# Patient Record
Sex: Female | Born: 1990 | Race: Black or African American | Hispanic: No | Marital: Single | Smoking: Never smoker
Health system: Southern US, Community
[De-identification: ages and names within clinical notes are randomized; demographics above are authoritative.]

---
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
## 2010-10-05 ENCOUNTER — Inpatient Hospital Stay (INDEPENDENT_AMBULATORY_CARE_PROVIDER_SITE_OTHER)
Admission: RE | Admit: 2010-10-05 | Discharge: 2010-10-05 | Disposition: A | Discharge status: Home / Self Care | Payer: 59 | Source: Ambulatory Visit | Attending: Family Medicine | Admitting: Family Medicine

## 2010-10-05 DIAGNOSIS — J069 Acute upper respiratory infection, unspecified: Secondary | ICD-10-CM

## 2010-12-01 ENCOUNTER — Emergency Department (HOSPITAL_COMMUNITY)
Admission: EM | Admit: 2010-12-01 | Discharge: 2010-12-02 | Disposition: A | Discharge status: Home / Self Care | Payer: No Typology Code available for payment source | Attending: Emergency Medicine | Admitting: Emergency Medicine

## 2010-12-01 ENCOUNTER — Emergency Department (HOSPITAL_COMMUNITY): Payer: No Typology Code available for payment source

## 2010-12-01 DIAGNOSIS — Y929 Unspecified place or not applicable: Secondary | ICD-10-CM | POA: Insufficient documentation

## 2010-12-01 DIAGNOSIS — S8010XA Contusion of unspecified lower leg, initial encounter: Secondary | ICD-10-CM | POA: Insufficient documentation

## 2010-12-01 DIAGNOSIS — M79609 Pain in unspecified limb: Secondary | ICD-10-CM | POA: Insufficient documentation
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
## 2014-06-11 ENCOUNTER — Emergency Department (HOSPITAL_COMMUNITY): Admission: EM | Admit: 2014-06-11 | Discharge: 2014-06-11 | Discharge status: Absent without leave | Payer: Self-pay

## 2014-06-11 ENCOUNTER — Emergency Department (HOSPITAL_COMMUNITY)
Admission: EM | Admit: 2014-06-11 | Discharge: 2014-06-11 | Disposition: A | Discharge status: Home / Self Care | Payer: 59 | Attending: Emergency Medicine | Admitting: Emergency Medicine

## 2014-06-11 ENCOUNTER — Encounter (HOSPITAL_COMMUNITY): Payer: Self-pay | Admitting: Emergency Medicine

## 2014-06-11 DIAGNOSIS — F430 Acute stress reaction: Secondary | ICD-10-CM | POA: Diagnosis not present

## 2014-06-11 DIAGNOSIS — Z3202 Encounter for pregnancy test, result negative: Secondary | ICD-10-CM | POA: Insufficient documentation

## 2014-06-11 DIAGNOSIS — R630 Anorexia: Secondary | ICD-10-CM | POA: Insufficient documentation

## 2014-06-11 DIAGNOSIS — F329 Major depressive disorder, single episode, unspecified: Secondary | ICD-10-CM | POA: Insufficient documentation

## 2014-06-11 DIAGNOSIS — F32A Depression, unspecified: Secondary | ICD-10-CM

## 2014-06-11 LAB — ETHANOL

## 2014-06-11 LAB — COMPREHENSIVE METABOLIC PANEL
ALBUMIN: 3.9 g/dL (ref 3.5–5.2)
ALT: 9 U/L (ref 0–35)
AST: 15 U/L (ref 0–37)
Alkaline Phosphatase: 58 U/L (ref 39–117)
Anion gap: 13 (ref 5–15)
BUN: 7 mg/dL (ref 6–23)
CALCIUM: 9.8 mg/dL (ref 8.4–10.5)
CO2: 25 mEq/L (ref 19–32)
CREATININE: 0.86 mg/dL (ref 0.50–1.10)
Chloride: 101 mEq/L (ref 96–112)
GFR calc Af Amer: >90 mL/min (ref >90)
Glucose, Bld: 96 mg/dL (ref 70–99)
Potassium: 3.7 mEq/L (ref 3.7–5.3)
Sodium: 139 mEq/L (ref 137–147)
TOTAL PROTEIN: 7.8 g/dL (ref 6.0–8.3)
Total Bilirubin: 0.2 mg/dL — ABNORMAL LOW (ref 0.3–1.2)

## 2014-06-11 LAB — SALICYLATE LEVEL

## 2014-06-11 LAB — CBC
HCT: 42.9 % (ref 36.0–46.0)
Hemoglobin: 14.7 g/dL (ref 12.0–15.0)
MCH: 30.6 pg (ref 26.0–34.0)
MCHC: 34.3 g/dL (ref 30.0–36.0)
MCV: 89.4 fL (ref 78.0–100.0)
PLATELETS: 220 10*3/uL (ref 150–400)
RBC: 4.8 MIL/uL (ref 3.87–5.11)
RDW: 12.1 % (ref 11.5–15.5)
WBC: 5.5 10*3/uL (ref 4.0–10.5)

## 2014-06-11 LAB — RAPID URINE DRUG SCREEN, HOSP PERFORMED
Amphetamines: NOT DETECTED
BENZODIAZEPINES: NOT DETECTED
Barbiturates: NOT DETECTED
Cocaine: NOT DETECTED
OPIATES: NOT DETECTED
Tetrahydrocannabinol: NOT DETECTED

## 2014-06-11 LAB — POC URINE PREG, ED: PREG TEST UR: NEGATIVE

## 2014-06-11 LAB — ACETAMINOPHEN LEVEL: Acetaminophen (Tylenol), Serum: <15 ug/mL (ref 10–30)

## 2014-06-11 NOTE — ED Notes (Signed)
Patient states she is coming for help with depression. Patient states she would like to be placed on an antidepressant. Patient reports a history of suicidal thoughts, but denies them at this time. Patient has an appointment scheduled in November for an initial appointment with a psychiatrist. Patient accompanied by her mother who reports a long family history of depression. Patient states she currently feels depressed over school, states she is behind and is starting to preform poorly. 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

## 2014-06-11 NOTE — Discharge Instructions (Signed)
Depression °Depression refers to feeling sad, low, down in the dumps, blue, gloomy, or empty. In general, there are two kinds of depression: °1. Normal sadness or normal grief. This kind of depression is one that we all feel from time to time after upsetting life experiences, such as the loss of a job or the ending of a relationship. This kind of depression is considered normal, is short lived, and resolves within a few days to 2 weeks. Depression experienced after the loss of a loved one (bereavement) often lasts longer than 2 weeks but normally gets better with time. °2. Clinical depression. This kind of depression lasts longer than normal sadness or normal grief or interferes with your ability to function at home, at work, and in school. It also interferes with your personal relationships. It affects almost every aspect of your life. Clinical depression is an illness. °Symptoms of depression can also be caused by conditions other than those mentioned above, such as: °· Physical illness. Some physical illnesses, including underactive thyroid gland (hypothyroidism), severe anemia, specific types of cancer, diabetes, uncontrolled seizures, heart and lung problems, strokes, and chronic pain are commonly associated with symptoms of depression. °· Side effects of some prescription medicine. In some people, certain types of medicine can cause symptoms of depression. °· Substance abuse. Abuse of alcohol and illicit drugs can cause symptoms of depression. °SYMPTOMS °Symptoms of normal sadness and normal grief include the following: °· Feeling sad or crying for short periods of time. °· Not caring about anything (apathy). °· Difficulty sleeping or sleeping too much. °· No longer able to enjoy the things you used to enjoy. °· Desire to be by oneself all the time (social isolation). °· Lack of energy or motivation. °· Difficulty concentrating or remembering. °· Change in appetite or weight. °· Restlessness or  agitation. °Symptoms of clinical depression include the same symptoms of normal sadness or normal grief and also the following symptoms: °· Feeling sad or crying all the time. °· Feelings of guilt or worthlessness. °· Feelings of hopelessness or helplessness. °· Thoughts of suicide or the desire to harm yourself (suicidal ideation). °· Loss of touch with reality (psychotic symptoms). Seeing or hearing things that are not real (hallucinations) or having false beliefs about your life or the people around you (delusions and paranoia). °DIAGNOSIS  °The diagnosis of clinical depression is usually based on how bad the symptoms are and how long they have lasted. Your health care provider will also ask you questions about your medical history and substance use to find out if physical illness, use of prescription medicine, or substance abuse is causing your depression. Your health care provider may also order blood tests. °TREATMENT  °Often, normal sadness and normal grief do not require treatment. However, sometimes antidepressant medicine is given for bereavement to ease the depressive symptoms until they resolve. °The treatment for clinical depression depends on how bad the symptoms are but often includes antidepressant medicine, counseling with a mental health professional, or both. Your health care provider will help to determine what treatment is best for you. °Depression caused by physical illness usually goes away with appropriate medical treatment of the illness. If prescription medicine is causing depression, talk with your health care provider about stopping the medicine, decreasing the dose, or changing to another medicine. °Depression caused by the abuse of alcohol or illicit drugs goes away when you stop using these substances. Some adults need professional help in order to stop drinking or using drugs. °SEEK IMMEDIATE MEDICAL   CARE IF: °· You have thoughts about hurting yourself or others. °· You lose touch  with reality (have psychotic symptoms). °· You are taking medicine for depression and have a serious side effect. °FOR MORE INFORMATION °· National Alliance on Mental Illness: www.nami.org  °· National Institute of Mental Health: www.nimh.nih.gov  °Document Released: 07/28/2000 Document Revised: 12/15/2013 Document Reviewed: 10/30/2011 °ExitCare® Patient Information ©2015 [employer]. This information is not intended to replace advice given to you by your health care provider. Make sure you discuss any questions you have with your health care provider. ° °Suicidal Feelings, How to Help Yourself °Everyone feels sad or unhappy at times, but depressing thoughts and feelings of hopelessness can lead to thoughts of suicide. It can seem as if life is too tough to handle. If you feel as though you have reached the point where suicide is the only answer, it is time to let someone know immediately.  °HOW TO COPE AND PREVENT SUICIDE °· Let family, friends, teachers, or counselors know. Get help. Try not to isolate yourself from those who care about you. Even though you may not feel sociable, talk with someone every day. It is best if it is face-to-face. Remember, they will want to help you. °· Eat a regularly spaced and well-balanced diet. °· Get plenty of rest. °· Avoid alcohol and drugs because they will only make you feel worse and may also lower your inhibitions. Remove them from the home. If you are thinking of taking an overdose of your prescribed medicines, give your medicines to someone who can give them to you one day at a time. If you are on antidepressants, let your caregiver know of your feelings so he or she can provide a safer medicine, if that is a concern. °· Remove weapons or poisons from your home. °· Try to stick to routines. Follow a schedule and remind yourself that you have to keep that schedule every day. °· Set some realistic goals and achieve them. Make a list and cross things off as you go.  Accomplishments give a sense of worth. Wait until you are feeling better before doing things you find difficult or unpleasant to do. °· If you are able, try to start exercising. Even half-hour periods of exercise each day will make you feel better. Getting out in the sun or into nature helps you recover from depression faster. If you have a favorite place to walk, take advantage of that. °· Increase safe activities that have always given you pleasure. This may include playing your favorite music, reading a good book, painting a picture, or playing your favorite instrument. Do whatever takes your mind off your depression. °· Keep your living space well-lighted. °GET HELP °Contact a suicide hotline, crisis center, or local suicide prevention center for help right away. Local centers may include a hospital, clinic, community service organization, social service provider, or health department. °· Call your local emergency services (911 in the [place]). °· Call a suicide hotline: °¨ 1-800-273-TALK (1-800-273-8255) in the [place]. °¨ 1-800-SUICIDE (1-800-784-2433) in the [place]. °¨ 1-888-628-9454 in the [place] for Spanish-speaking counselors. °¨ 1-800-799-4TTY (1-800-799-4889) in the [place] for TTY users. °· Visit the following websites for information and help: °¨ National Suicide Prevention Lifeline: www.suicidepreventionlifeline.org °¨ Hopeline: www.hopeline.com °¨ American Foundation for Suicide Prevention: www.afsp.org °· For lesbian, gay, bisexual, transgender, or questioning youth, contact The Trevor Project: °¨ 1-866-4-U-TREVOR (1-866-488-7386) in the [place]. °¨ www.thetrevorproject.org °· In [place], treatment resources are listed in each   [place] with listings available under USAA for [employer] or similar titles. Another source for Crisis Centres by [place] is located at  http://www.suicideprevention.ca/in-crisis-now/find-a-crisis-centre-now/crisis-centres Document Released: 02/04/2003 Document Revised: 10/23/2011 Document Reviewed: 11/25/2013 [facility] Patient Information 2015 [place], [place]. This information is not intended to replace advice given to you by your health care provider. Make sure you discuss any questions you have with your health care provider.   Emergency Department Resource Guide 1) Find a Doctor and Pay Out of Pocket Although you won't have to find out who is covered by your insurance plan, it is a good idea to ask around and get recommendations. You will then need to call the office and see if the doctor you have chosen will accept you as a new patient and what types of options they offer for patients who are self-pay. Some doctors offer discounts or will set up payment plans for their patients who do not have insurance, but you will need to ask so you aren't surprised when you get to your appointment.  2) Contact Your Local Health Department Not all health departments have doctors that can see patients for sick visits, but many do, so it is worth a call to see if yours does. If you don't know where your local health department is, you can check in your phone book. The CDC also has a tool to help you locate your state's health department, and many state websites also have listings of all of their local health departments.  3) Find a [facility] [place] [facility] If your illness is not likely to be very severe or complicated, you may want to try a walk in clinic. These are popping up all over the country in pharmacies, drugstores, and shopping centers. They're usually staffed by nurse practitioners or physician assistants that have been trained to treat common illnesses and complaints. They're usually fairly quick and inexpensive. However, if you have serious medical issues or chronic medical problems, these are probably not your best option.  No Primary Care  Doctor: - Call Health Connect at  [phone] - they can help you locate a primary care doctor that  accepts your insurance, provides certain services, etc. - Physician Referral Service- [phone]  Chronic Pain Problems: Organization         Address  Phone   Notes  [facility]  [phone] Patients need to be referred by their primary care doctor.   Medication Assistance: Organization         Address  Phone   Notes  [facility] Medication [facility] [address] [place], [place] [address] [phone] --Must be a resident of [facility] -- Must have NO insurance coverage whatsoever (no Medicaid/ Medicare, etc.) -- The pt. MUST have a primary care doctor that directs their care regularly and follows them in the community   MedAssist  [phone]   [employer]  [phone]    Agencies that provide inexpensive medical care: Organization         Address  Phone   Notes  [place]  [phone]   [facility]    [phone]   [facility] [place], [place] [address] [phone]   [place] [phone]   Planned Parenthood    [phone]   [facility]    [phone]   [facility]  [address], [place] Phone:  [phone], Fax:  [phone] Hours of Operation:  9 am - 6 pm, M-F.  Also accepts Medicaid/Medicare and self-pay.  [facility] for [place] [address], [address], [place] Phone: [phone], Fax: [phone]. Hours of Operation:  8:30 am - 5:30 pm, M-F.  Also accepts Medicaid and self-pay.  [facility] [place] [address], [place] Phone: [phone]   [place], [place], [place] [phone], Ext. [phone] Mondays & Thursdays: 7-9 AM.  First 15 patients are seen on a first  come, first serve basis.    [place] Providers:  Organization         Address  Phone   Notes  [facility] [address], [place] [phone] Also accepts self-pay patients.  [facility] [address], [place]  [phone]   [place], [address], [place] [phone]   [facility] [address], [place] [phone]   Lucianne Lei [address], [place]   [phone] Only accepts [place] Access [place] patients after they have their name applied to their card.   Self-Pay (no insurance) in [facility]:  Organization         Address  Phone   Notes  Sickle Cell Patients, [facility] [place] [phone]   [facility] [place] [phone]   [facility] [place]  [place], [place],  [phone]   [facility]/Dr. Osei-Bonsu  [address] [place] or [address], [place] [phone] Phone number for both [place] and [place] locations is the same.  Urgent Medical and [facility] [address], [place] [phone]   [facility] [address] [place] or [address] [phone]   [facility] [address] [place] [phone], phone; [phone], fax Sees patients 1st and 3rd Saturday of every month.  Must not qualify for public or private insurance (i.e. Medicaid, Medicare, [place] Health Choice, Veterans' Benefits)  Household income should be no more than 200% of the poverty level The clinic cannot treat you if you are pregnant or think you are pregnant  Sexually transmitted diseases are not treated at the clinic.    Dental Care: Organization          Address  Phone  Notes  [facility] Department of [facility] [place] [phone] Accepts children up to age 5 who are enrolled in [place] or [place]; pregnant women with a Medicaid card; and children who have applied for Medicaid or [place] Health Choice, but were declined, whose parents can pay a reduced fee at time of service.  [facility] Department of [facility]  [address], [place] [phone] Accepts children up to age 14 who are enrolled in [place] or [place]; pregnant women with a Medicaid card; and children who have applied for Medicaid or  Health Choice, but were declined, whose parents can pay a reduced fee at time of service.  [facility] Adult Dental Access PROGRAM  [place], [place] [phone]  Patients are seen by appointment only. Walk-ins are not accepted. [place] will see patients 62 years of age and older. Monday - Tuesday (8am-5pm) Most Wednesdays (8:30-5pm) $30 per visit, cash only  [facility] Adult Dental Access PROGRAM  [address], [facility] [phone] Patients are seen by appointment only. Walk-ins are not accepted. [place] will see patients 76 years of age and older. One Wednesday Evening (Monthly: Volunteer Based).  $30 per visit, cash only  [place]  [phone] for adults; Children under age 48, call Graduate Pediatric Dentistry at [phone]. Children aged 49-14, please call [phone] to request a pediatric application.  Dental services are provided in all areas of dental care including fillings, crowns and bridges, complete and partial dentures, implants, gum treatment, root canals, and extractions. Preventive care is also provided. Treatment is provided to both adults and children. Patients are selected via a lottery and there is often a waiting list.   [facility] [address] [place]  [phone] [email or web address]   [facility] [address] [place], [place] [phone], Ext. [phone] Second and Fourth Thursday of each month, opens at 6:30 AM; Clinic ends at 9 AM.  Patients are seen on a first-come first-served basis, and a limited number are seen during each clinic.   [facility]  [address] Hillard Danker [place], [place] [phone]   Eligibility Requirements You must have lived in [place], [place], or [place] counties for at least the last three months.   You cannot be eligible for state or federal sponsored [employer], including [employer], [place], or [employer].   You generally cannot be eligible for healthcare insurance through your employer.    How to apply: Eligibility screenings are held every Tuesday and Wednesday afternoon from 1:00 pm until 4:00 pm. You do not need an appointment for the interview!  [facility] [address] [place], [place]   [place]  [place] Department  [place]  [phone]    Behavioral Health Resources in the Community: Intensive Outpatient Programs Organization         Address  Phone  Notes  [place]. [address] [place], [place] [phone]   [facility] [place] [facility] Outpatient [address], [place], [place]   ADS: Alcohol & Drug Svcs [address] [place], [place]   [place] [address],  [place], [place] or [phone]   Substance Abuse Resources Organization         Address  Phone  Notes  Alcohol and Drug Services  [phone]   [place]  [phone]   [facility] [place]   Chinita Pester  [phone]   Residential & Outpatient Substance Abuse Program  [phone]   Psychological  Services Organization         Address  Phone  Notes  [facility] [place]  [place]  [phone]   [place] [address], [place] or [phone]    Mobile Crisis Teams Organization         Address  Phone  Notes  [facility], Mobile Crisis Care Unit  [phone]   [facility] Psychotherapeutic [facility]  [address] [place], [place]   [facility] [address], [place]  [place] [phone]    Self-Help/Support Groups Organization         Address  Phone             Notes  Mental Health Assoc. of [place] - variety of support groups  [place] Call for more information  Narcotics Anonymous (NA), [facility] [address], [employer] [place]  2 meetings at this location   Special educational needs teacher         Address  Phone  Notes  [facility] Residential Treatment [place],    [place]  [phone]   [facility]  [address] [place] [address], [place], [place]   [place], [place] [phone] Admissions: 8am-3pm M-F  Incentives Substance [place] [address],    [place], [place] [phone]   [facility] [address] [place], [place], [place]   [facility] [address]  [place], [place]   [facility] - Intensive Outpatient [address], [place], [place]   [facility] [place]  [place], [place] [phone] or [phone]   [facility] Services (RT[facility]) [address], [place], [place] Accepts Medicaid  [facility] [place] [address]  [place] [phone] Substance Abuse/Addiction Treatment   [facility] Organization         Address  Phone  Notes  [facility]  [phone]   Domenic Schwab, PhD [address] [place], [place]   [phone] or [phone]   Luis Lopez [place], [place] [phone]   [facility] [address], [place], [place] [phone] Insurance/Medicaid/sponsorship through [facility] and Families [address], [address]                                    [place], [place] [phone] [place] [address], [place] [phone]    Dr. Adele Schilder  [phone]   [facility] Dept. 1) [address], [place] 2) [place] 3)  [place] [address], [place] [phone]  [phone]   [place] [phone] or [phone] (After Hours)

## 2014-06-11 NOTE — ED Provider Notes (Signed)
CSN: [id number]     Arrival date & time 06/11/14  1937 History   First MD Initiated Contact with Patient 06/11/14 2027     Chief Complaint  Patient presents with  . Depression     (Consider location/radiation/quality/duration/timing/severity/associated sxs/prior Treatment) Patient is a 23 y.o. female presenting with mental health disorder.  Mental Health Problem Presenting symptoms: depression   Degree of incapacity (severity):  Severe Onset quality:  Gradual Duration: 1 year. Timing:  Constant Progression:  Worsening Chronicity:  Chronic Context: stressful life event (School stress)   Relieved by:  Nothing Associated symptoms: anhedonia, appetite change, feelings of worthlessness and trouble in school   Associated symptoms: no headaches   Risk factors: no hx of suicide attempts     History reviewed. No pertinent past medical history. History reviewed. No pertinent past surgical history. No family history on file. History  Substance Use Topics  . Smoking status: Never Smoker   . Smokeless tobacco: Not on file  . Alcohol Use: Yes     Comment: socially   OB History   Grav Para Term Preterm Abortions TAB SAB Ect Mult Living                 Review of Systems  Constitutional: Positive for appetite change.  Neurological: Negative for headaches.  All other systems reviewed and are negative.     Allergies  Sulfa antibiotics  Home Medications   Prior to Admission medications   Not on File   BP 119/72  Pulse 62  Temp(Src) 98.1 F (36.7 C)  Resp 18  Ht 5\' 5"  (1.651 m)  Wt 160 lb (72.576 kg)  BMI 26.63 kg/m2  SpO2 100% Physical Exam  Nursing note and vitals reviewed. Constitutional: She is oriented to person, place, and time. She appears well-developed and well-nourished. No distress.  HENT:  Head: Normocephalic and atraumatic.  Eyes: Conjunctivae are normal. No scleral icterus.  Neck: Neck supple.  Cardiovascular: Normal rate and intact distal pulses.    Pulmonary/Chest: Effort normal. No stridor. No respiratory distress.  Abdominal: Normal appearance. She exhibits no distension.  Neurological: She is alert and oriented to person, place, and time.  Skin: Skin is warm and dry. No rash noted.  Psychiatric: Her behavior is normal. She exhibits a depressed mood. She expresses no homicidal and no suicidal ideation. She expresses no suicidal plans and no homicidal plans.    ED Course  Procedures (including critical care time) Labs Review Labs Reviewed  COMPREHENSIVE METABOLIC PANEL - Abnormal; Notable for the following:    Total Bilirubin 0.2 (*)    All other components within normal limits  SALICYLATE LEVEL - Abnormal; Notable for the following:    Salicylate Lvl <2.0 (*)    All other components within normal limits  ACETAMINOPHEN LEVEL  CBC  ETHANOL  URINE RAPID DRUG SCREEN (HOSP PERFORMED)  POC URINE PREG, ED    Imaging Review No results found.   EKG Interpretation None      MDM   Final diagnoses:  Depression    Physical female who presents with chief complaint of depression. This been going on for about a year. She states that she has occasionally had thoughts of suicide over the past year, but none currently. She reports having adequate support network. She does not have access to any weapons. Her mother is with her providing support. She appears appropriate for outpatient psychiatric treatment. She was advised that if her suicidal ideations return, she needs to seek immediate medical  care. Patient agrees to this plan. Given outpatient resources.    Warnell Foresterrey Merisa Julio, MD 06/11/14 2249

## 2014-06-11 NOTE — ED Notes (Signed)
Pt left before she could be given discharge paperwork.

## 2021-05-29 ENCOUNTER — Other Ambulatory Visit: Payer: Self-pay

## 2021-05-29 ENCOUNTER — Ambulatory Visit (HOSPITAL_COMMUNITY)
Admission: EM | Admit: 2021-05-29 | Discharge: 2021-05-29 | Disposition: A | Discharge status: Home / Self Care | Payer: No Payment, Other | Attending: Family | Admitting: Family

## 2021-05-29 DIAGNOSIS — R45851 Suicidal ideations: Secondary | ICD-10-CM | POA: Insufficient documentation

## 2021-05-29 DIAGNOSIS — F332 Major depressive disorder, recurrent severe without psychotic features: Secondary | ICD-10-CM | POA: Insufficient documentation

## 2021-05-29 DIAGNOSIS — Z634 Disappearance and death of family member: Secondary | ICD-10-CM | POA: Insufficient documentation

## 2021-05-29 MED ORDER — BUPROPION HCL ER (XL) 150 MG PO TB24: 150.0000 mg | ORAL_TABLET | ORAL | 0 refills | Status: DC

## 2021-05-29 MED ORDER — BUSPIRONE HCL 5 MG PO TABS: 5.0000 mg | ORAL_TABLET | Freq: Two times a day (BID) | ORAL | 0 refills | Status: DC

## 2021-05-29 NOTE — ED Provider Notes (Signed)
Behavioral Health Urgent Care Medical Screening Exam  Patient Name: Stacey Travis MRN: [id number] Date of Evaluation: 05/29/21 Chief Complaint:   Diagnosis:  Final diagnoses:  Severe episode of recurrent major depressive disorder, without psychotic features (HCC)    Stacey Travis, 30 y.o., female patient seen face to face by this provider and chart reviewed on 05/29/21.  On evaluation Stacey Travis   History of Present illness: Stacey Travis is a 30 y.o. female.  Presents to [facility] urgent care voluntarily.  Patient is accompanied by a friend.  reports worsening stress depression and anxiety.  States she recently moved back from [place] to care for her grandmother.  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   Reported intermittent suicidal ideations. States last suicide attempt was February.  States she has thoughts of throwing a toaster in the bathtub.  Currently she is denying suicidal or homicidal ideations.  Denies auditory or visual hallucinations.  Patient is requesting resources for therapy and psychiatry.  States she was prescribed Wellbutrin and BuSpar in the past however did not keep follow-up appointments  Discussed following up with partial hospitalization program for [facility].  Patient was receptive to plan.  We will make additional outpatient resources available for walk-in clinic hours.  Support encouragement reassurance was provided  During evaluation Stacey Travis is sitting in no acute distress.  She is alert/oriented x 4; calm/cooperative; and mood congruent with affect. She is speaking in a clear tone at moderate volume, and normal pace; with good eye contact. Her thought process is coherent and relevant; There is no indication that she is currently responding to internal/external stimuli or experiencing delusional thought content; and she has  denied suicidal/self-harm/homicidal ideation, psychosis, and paranoia.   Patient has remained calm throughout assessment and has answered questions appropriately.     At this time Stacey Travis is educated and verbalizes understanding of mental health resources and other crisis services in the community. She is instructed to call 911 and present to the nearest emergency room should she experience any suicidal/homicidal ideation, auditory/visual/hallucinations, or detrimental worsening of her mental health condition. She was a also advised by Clinical research associate that she could call the toll-free phone on insurance card to assist with identifying in network counselors and agencies or number on back of Medicaid card to speak with care coordinator.    Psychiatric Specialty Exam  Presentation  General Appearance:Appropriate for Environment  Eye Contact:Fair  Speech:Clear and Coherent  Speech Volume:Normal  Handedness:Right   Mood and Affect  Mood:Anxious; Depressed  Affect:Congruent   Thought Process  Thought Processes:Coherent  Descriptions of Associations:Intact  Orientation:Full (Time, Place and Person)  Thought Content:Logical    Hallucinations:None  Ideas of Reference:None  Suicidal Thoughts:No  Homicidal Thoughts:No   Sensorium  Memory:Recent Good  Judgment:Good  Insight:Fair   Executive Functions  Concentration:Fair  Attention Span:Good  Recall:Good  Fund of Knowledge:Good  Language:Good   Psychomotor Activity  Psychomotor Activity:Normal   Assets  Assets:Financial Resources/Insurance; Social Support   Sleep  Sleep:Fair  Number of hours:  No data recorded  Nutritional Assessment (For [facility] and [facility] admissions only) Has the patient had a weight loss or gain of 10 pounds or more in the last 3 months?: No Has the patient had a decrease in food intake/or appetite?: No Does the patient have dental problems?: No Does the patient have eating habits or  behaviors that may be indicators  of an eating disorder including binging or inducing vomiting?: No Has the patient recently lost weight without trying?: 0 Has the patient been eating poorly because of a decreased appetite?: 0 Malnutrition Screening Tool Score: 0    Physical Exam: Physical Exam Cardiovascular:     Rate and Rhythm: Normal rate.  Pulmonary:     Effort: Pulmonary effort is normal.     Breath sounds: Normal breath sounds.  Neurological:     Mental Status: She is alert and oriented to person, place, and time.  Psychiatric:        Mood and Affect: Mood normal.        Thought Content: Thought content normal.   Review of Systems  Eyes: Negative.   Cardiovascular: Negative.   Musculoskeletal: Negative.   Psychiatric/Behavioral:  Negative for depression and suicidal ideas. The patient is nervous/anxious.   All other systems reviewed and are negative. Blood pressure 139/86, pulse 70, temperature 98.6 F (37 C), temperature source Oral, resp. rate 18, height 5\' 5"  (1.651 m), weight 133 lb (60.3 kg), SpO2 100 %. Body mass index is 22.13 kg/m.  Musculoskeletal: Strength & Muscle Tone: within normal limits Gait & Station: normal Patient leans: N/A   [facility] MSE Discharge Disposition for Follow up and Recommendations: Based on my evaluation the patient does not appear to have an emergency medical condition and can be discharged with resources and follow up care in outpatient services for Partial Hospitalization Program  - Restarted Wellbutrin 150 mg Xl daily - Buspar 5 mg po BID  - patient to Start partial Hospitalization programming - [facility]    , NP 05/29/2021, 11:53 AM

## 2021-05-29 NOTE — Discharge Instructions (Addendum)
Take all medications as prescribed. Keep all follow-up appointments as scheduled.  Do not consume alcohol or use illegal drugs while on prescription medications. Report any adverse effects from your medications to your primary care provider promptly.  In the event of recurrent symptoms or worsening symptoms, call 911, a crisis hotline, or go to the nearest emergency department for evaluation.   
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
## 2021-05-31 ENCOUNTER — Telehealth (HOSPITAL_COMMUNITY): Payer: Self-pay | Admitting: Professional

## 2021-06-16 ENCOUNTER — Ambulatory Visit (HOSPITAL_COMMUNITY): Payer: BC Managed Care – PPO

## 2021-06-16 ENCOUNTER — Telehealth (HOSPITAL_COMMUNITY): Payer: Self-pay | Admitting: Professional

## 2021-06-20 ENCOUNTER — Ambulatory Visit (HOSPITAL_COMMUNITY): Payer: No Payment, Other | Admitting: Professional

## 2021-06-20 DIAGNOSIS — F332 Major depressive disorder, recurrent severe without psychotic features: Secondary | ICD-10-CM | POA: Insufficient documentation
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
## 2021-06-22 ENCOUNTER — Telehealth (HOSPITAL_COMMUNITY): Payer: Self-pay | Admitting: Professional

## 2021-06-22 ENCOUNTER — Ambulatory Visit (HOSPITAL_COMMUNITY): Payer: BC Managed Care – PPO

## 2021-06-23 ENCOUNTER — Ambulatory Visit (HOSPITAL_COMMUNITY): Payer: Self-pay

## 2021-06-24 ENCOUNTER — Ambulatory Visit (HOSPITAL_COMMUNITY): Payer: Self-pay

## 2021-06-27 ENCOUNTER — Ambulatory Visit (HOSPITAL_COMMUNITY): Payer: Self-pay

## 2021-06-28 ENCOUNTER — Ambulatory Visit (HOSPITAL_COMMUNITY): Payer: Self-pay

## 2021-06-29 ENCOUNTER — Ambulatory Visit (HOSPITAL_COMMUNITY): Payer: Self-pay

## 2021-06-30 ENCOUNTER — Ambulatory Visit (HOSPITAL_COMMUNITY): Payer: Self-pay

## 2021-07-01 ENCOUNTER — Ambulatory Visit (HOSPITAL_COMMUNITY): Payer: Self-pay

## 2021-07-09 ENCOUNTER — Encounter (HOSPITAL_COMMUNITY): Payer: Self-pay | Admitting: Emergency Medicine

## 2021-07-09 ENCOUNTER — Ambulatory Visit (HOSPITAL_COMMUNITY)
Admission: EM | Admit: 2021-07-09 | Discharge: 2021-07-10 | Disposition: A | Discharge status: Other Acute Inpatient Hospital | Payer: No Payment, Other | Attending: Behavioral Health | Admitting: Behavioral Health

## 2021-07-09 DIAGNOSIS — Z9151 Personal history of suicidal behavior: Secondary | ICD-10-CM | POA: Insufficient documentation

## 2021-07-09 DIAGNOSIS — F332 Major depressive disorder, recurrent severe without psychotic features: Secondary | ICD-10-CM

## 2021-07-09 DIAGNOSIS — R45851 Suicidal ideations: Secondary | ICD-10-CM | POA: Insufficient documentation

## 2021-07-09 DIAGNOSIS — Z5902 Unsheltered homelessness: Secondary | ICD-10-CM | POA: Insufficient documentation

## 2021-07-09 DIAGNOSIS — F1290 Cannabis use, unspecified, uncomplicated: Secondary | ICD-10-CM | POA: Insufficient documentation

## 2021-07-09 DIAGNOSIS — F339 Major depressive disorder, recurrent, unspecified: Secondary | ICD-10-CM | POA: Insufficient documentation

## 2021-07-09 DIAGNOSIS — Z20822 Contact with and (suspected) exposure to covid-19: Secondary | ICD-10-CM | POA: Insufficient documentation

## 2021-07-09 DIAGNOSIS — Z560 Unemployment, unspecified: Secondary | ICD-10-CM | POA: Insufficient documentation

## 2021-07-09 LAB — POCT URINE DRUG SCREEN - MANUAL ENTRY (I-CUP)
POC Buprenorphine (BUP): NOT DETECTED
POC Cocaine UR: NOT DETECTED
POC Marijuana UR: POSITIVE — AB
POC Oxazepam (BZO): NOT DETECTED
POC Secobarbital (BAR): NOT DETECTED

## 2021-07-09 LAB — COMPREHENSIVE METABOLIC PANEL
ALT: 11 U/L (ref 0–44)
AST: 16 U/L (ref 15–41)
Albumin: 4.6 g/dL (ref 3.5–5.0)
Alkaline Phosphatase: 54 U/L (ref 38–126)
Anion gap: 9 (ref 5–15)
BUN: 10 mg/dL (ref 6–20)
CO2: 27 mmol/L (ref 22–32)
Calcium: 10.3 mg/dL (ref 8.9–10.3)
Chloride: 103 mmol/L (ref 98–111)
Creatinine, Ser: 0.92 mg/dL (ref 0.44–1.00)
GFR, Estimated: >60 mL/min (ref >60)
Glucose, Bld: 91 mg/dL (ref 70–99)
Potassium: 4.2 mmol/L (ref 3.5–5.1)
Sodium: 139 mmol/L (ref 135–145)
Total Bilirubin: 0.9 mg/dL (ref 0.3–1.2)
Total Protein: 7.4 g/dL (ref 6.5–8.1)

## 2021-07-09 LAB — POCT URINE DRUG SCREEN - MANUAL ENTRY (I-SCREEN)
POC Amphetamine UR: NOT DETECTED
POC Methadone UR: NOT DETECTED
POC Methamphetamine UR: NOT DETECTED
POC Morphine: NOT DETECTED
POC Oxycodone UR: NOT DETECTED

## 2021-07-09 LAB — CBC WITH DIFFERENTIAL/PLATELET
Abs Immature Granulocytes: 0.01 10*3/uL (ref 0.00–0.07)
Basophils Absolute: 0 10*3/uL (ref 0.0–0.1)
Basophils Relative: 1 %
Eosinophils Absolute: 0.1 10*3/uL (ref 0.0–0.5)
Eosinophils Relative: 1 %
HCT: 45 % (ref 36.0–46.0)
Hemoglobin: 14.8 g/dL (ref 12.0–15.0)
Immature Granulocytes: 0 %
Lymphocytes Relative: 37 %
Lymphs Abs: 1.9 10*3/uL (ref 0.7–4.0)
MCH: 30.6 pg (ref 26.0–34.0)
MCHC: 32.9 g/dL (ref 30.0–36.0)
MCV: 93.2 fL (ref 80.0–100.0)
Monocytes Absolute: 0.5 10*3/uL (ref 0.1–1.0)
Monocytes Relative: 10 %
Neutro Abs: 2.6 10*3/uL (ref 1.7–7.7)
Neutrophils Relative %: 51 %
Platelets: 217 10*3/uL (ref 150–400)
RBC: 4.83 MIL/uL (ref 3.87–5.11)
RDW: 12.3 % (ref 11.5–15.5)
WBC: 5.1 10*3/uL (ref 4.0–10.5)
nRBC: 0 % (ref 0.0–0.2)

## 2021-07-09 LAB — POC SARS CORONAVIRUS 2 AG -  ED: SARS Coronavirus 2 Ag: NEGATIVE

## 2021-07-09 LAB — RESP PANEL BY RT-PCR (FLU A&B, COVID) ARPGX2
Influenza A by PCR: NEGATIVE
Influenza B by PCR: NEGATIVE
SARS Coronavirus 2 by RT PCR: NEGATIVE

## 2021-07-09 LAB — POCT PREGNANCY, URINE: Preg Test, Ur: NEGATIVE

## 2021-07-09 LAB — TSH: TSH: 1.764 u[IU]/mL (ref 0.350–4.500)

## 2021-07-09 MED ORDER — MAGNESIUM HYDROXIDE 400 MG/5ML PO SUSP: 30.0000 mL | Freq: Every day | ORAL | Status: DC | PRN

## 2021-07-09 MED ORDER — TRAZODONE HCL 50 MG PO TABS
50.0000 mg | ORAL_TABLET | Freq: Every evening | ORAL | Status: DC | PRN
  Administered 2021-07-09: 50 mg via ORAL
  Filled 2021-07-09: qty 1

## 2021-07-09 MED ORDER — ACETAMINOPHEN 325 MG PO TABS: 650.0000 mg | ORAL_TABLET | Freq: Four times a day (QID) | ORAL | Status: DC | PRN

## 2021-07-09 MED ORDER — ALUM & MAG HYDROXIDE-SIMETH 200-200-20 MG/5ML PO SUSP: 30.0000 mL | ORAL | Status: DC | PRN

## 2021-07-09 MED ORDER — HYDROXYZINE HCL 25 MG PO TABS
25.0000 mg | ORAL_TABLET | Freq: Three times a day (TID) | ORAL | Status: DC | PRN
  Administered 2021-07-09 – 2021-07-10 (×2): 25 mg via ORAL
  Filled 2021-07-09 (×2): qty 1

## 2021-07-09 NOTE — ED Notes (Signed)
Pt sleeping@this time. Breathing even and unlabored. Will continue to monitor for safety 
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
## 2021-07-09 NOTE — Progress Notes (Signed)
   07/09/21 1625  [facility]UC Triage Screening (Walk-ins at [facility] only)  How Did You Hear About [place]? Family/Friend  What Is the Reason for Your Visit/Call Today? Patient presents with Aunt for assessment, reporting worsening depression and SI.  She wrote a suicide letter to her family today, saying goodbye. She had considered driving her car off the road and "maybe into [facility], but I'm not a drowner."  She has a history of past attempts/gestures, most recently Feb 2022. Patient continues to endorse SI, denies HI, denies AVH. She admits to daily THC use since 2016.  How Long Has This Been Causing You Problems? > than 6 months  Have You Recently Had Any Thoughts About Hurting Yourself? Yes  How long ago did you have thoughts about hurting yourself? Today  Are You Planning to Commit Suicide/Harm Yourself At This time? Yes  Have you Recently Had Thoughts About Hurting Someone Karolee Ohs? No  Are You Planning To Harm Someone At This Time? No  Are you currently experiencing any auditory, visual or other hallucinations? No  Have You Used Any Alcohol or Drugs in the Past 24 Hours? Yes  How long ago did you use Drugs or Alcohol? Today  What Did You Use and How Much? THC  Do you have any current medical co-morbidities that require immediate attention? No  Clinician description of patient physical appearance/behavior: Patient is calm, cooperative, AAOx4 and pleasant.  What Do You Feel Would Help You the Most Today? Treatment for Depression or other mood problem  If access to [facility] was not available, would you have sought care in the Emergency Department? Yes  Determination of Need Urgent (48 hours)  Options For Referral BH Urgent Care;Inpatient Hospitalization;Facility-Based Crisis

## 2021-07-09 NOTE — ED Notes (Addendum)
   Stacey Travis is a 30 y.o. female patient admitted due to depression and SI. Patient had thoughts of dropping toaster in the tub. Patient currently denies SI, HI, and AVH. Patient oriented to unit and unit rules. Patient has contracted for safety. Will continue to monitor for safety.   De Burrs, RN  07/09/2021 6:36 PM

## 2021-07-09 NOTE — ED Provider Notes (Addendum)
Behavioral Health Admission H&P [facility] & OBS)  Date: 07/09/21 Patient Name: Stacey Travis MRN: [id number] Chief Complaint: Suicidal and depression      Diagnoses:  Final diagnoses:  Severe episode of recurrent major depressive disorder, without psychotic features [facility])    HPI: Stacey Travis is a 30 year old African-American female who presents to [facility] urgent care accompanied by her aunt.  Reports worsening depression and suicidal ideations.  Per patient on patient wrote a suicide note. "  Saying her goodbyes to the family."  Patients aunt  stated that that patient has a history with Asperger.   Patient reported a history of suicide attempts in the past.  States she was taking Wellbutrin and BuSpar in the past however did not restart medications at her last visit to [facility] urgent care.  Discussed inpatient admission for additional treatment.  Patient was receptive to plan.  Per patient's aunt patient is currently homeless and has been residing in her car, she reported that patient is  having worsening suicidal thoughts. Patient is under review at [facility]. Support, encouragement and reassurances was provided    PHQ 2-9:  Health and safety inspector from 06/20/2021 in [facility]  Thoughts that you would be better off dead, or of hurting yourself in some way Nearly every day  PHQ-9 Total Score 16       Flowsheet Row Counselor from 06/20/2021 in [place] Moderate Risk        Total Time spent with patient: 15 minutes  Musculoskeletal  Strength & Muscle Tone: within normal limits Gait & Station: normal Patient leans: N/A  Psychiatric Specialty Exam  Presentation General Appearance: Appropriate for Environment  Eye Contact:Good  Speech:Clear and Coherent  Speech Volume:Normal  Handedness:Right   Mood and Affect  Mood:Anxious;  Depressed  Affect:Congruent   Thought Process  Thought Processes:Coherent  Descriptions of Associations:Intact  Orientation:Full (Time, Place and Person)  Thought Content:Logical  Diagnosis of Schizophrenia or Schizoaffective disorder in past: No   Hallucinations:Hallucinations: None Ideas of Reference:None  Suicidal Thoughts:Suicidal Thoughts: Yes, Passive SI Passive Intent and/or Plan: Without Plan Homicidal Thoughts:Homicidal Thoughts: No  Sensorium  Memory:Immediate Good; Recent Good; Remote Good  Judgment:Fair  Insight:Fair   Executive Functions  Concentration:Fair  Attention Span:Good  [place]  Language:Good   Psychomotor Activity  Psychomotor Activity:Psychomotor Activity: Normal  Assets  Assets:Housing   Sleep  Sleep:Sleep: Fair  Nutritional Assessment (For OBS and [facility]BC admissions only) Has the patient had a weight loss or gain of 10 pounds or more in the last 3 months?: No Has the patient had a decrease in food intake/or appetite?: No Does the patient have dental problems?: No Does the patient have eating habits or behaviors that may be indicators of an eating disorder including binging or inducing vomiting?: No Has the patient recently lost weight without trying?: 0   Physical Exam Vitals and nursing note reviewed.  Cardiovascular:     Pulses: Normal pulses.     Heart sounds: Normal heart sounds.  Neurological:     Mental Status: She is alert and oriented to person, place, and time.  Psychiatric:        Attention and Perception: Attention and perception normal.        Mood and Affect: Mood normal.        Speech: Speech normal.        Behavior: Behavior normal.  Thought Content: Thought content includes suicidal ideation.        Cognition and Memory: Cognition normal.        Judgment: Judgment normal.   Review of Systems  HENT: Negative.    Eyes: Negative.   Cardiovascular: Negative.    Musculoskeletal: Negative.   Skin: Negative.   Endo/Heme/Allergies: Negative.   Psychiatric/Behavioral:  Positive for depression and suicidal ideas. The patient is nervous/anxious.   All other systems reviewed and are negative.  Blood pressure 129/85, pulse 67, temperature 98.7 F (37.1 C), temperature source Oral, resp. rate 17, SpO2 100 %. There is no height or weight on file to calculate BMI.  Past Psychiatric History: History of major depressive disorder, generalized anxiety disorder and suicidal ideations.  Reports she was prescribed Wellbutrin and BuSpar in the past without follow-up.  Is the patient at risk to self? No  Has the patient been a risk to self in the past 6 months? No .    Has the patient been a risk to self within the distant past? No   Is the patient a risk to others? No   Has the patient been a risk to others in the past 6 months? No   Has the patient been a risk to others within the distant past? No   Past Medical History: No past medical history on file. No past surgical history on file.  Family History: No family history on file.  Social History:  Social History   Socioeconomic History   Marital status: Single    Spouse name: Not on file   Number of children: Not on file   Years of education: Not on file   Highest education level: Not on file  Occupational History   Not on file  Tobacco Use   Smoking status: Never   Smokeless tobacco: Not on file  Substance and Sexual Activity   Alcohol use: Yes    Comment: socially   Drug use: No   Sexual activity: Yes    Birth control/protection: Condom, Implant  Other Topics Concern   Not on file  Social History Narrative   Not on file   Social Determinants of Health   Financial Resource Strain: Not on file  Food Insecurity: Not on file  Transportation Needs: Not on file  Physical Activity: Not on file  Stress: Not on file  Social Connections: Not on file  Intimate Partner Violence: Not on file     SDOH:  SDOH Screenings   Alcohol Screen: Not on file  Depression (PHQ2-9): Medium Risk   PHQ-2 Score: 16  Financial Resource Strain: Not on file  Food Insecurity: Not on file  Housing: Not on file  Physical Activity: Not on file  Social Connections: Not on file  Stress: Not on file  Tobacco Use: Unknown   Smoking Tobacco Use: Never   Smokeless Tobacco Use: Unknown   Passive Exposure: Not on file  Transportation Needs: Not on file    Last Labs:  No visits with results within 6 Month(s) from this visit.  Latest known visit with results is:  Admission on 06/11/2014, Discharged on 06/11/2014  Component Date Value Ref Range Status   Acetaminophen (Tylenol), Serum 06/11/2014 <15.0  10 - 30 ug/mL Final   Comment:                                 THERAPEUTIC CONCENTRATIONS VARY  SIGNIFICANTLY. A RANGE OF 10-30                          ug/mL MAY BE AN EFFECTIVE                          CONCENTRATION FOR MANY PATIENTS.                          HOWEVER, SOME ARE BEST TREATED                          AT CONCENTRATIONS OUTSIDE THIS                          RANGE.                          ACETAMINOPHEN CONCENTRATIONS                          >150 ug/mL AT 4 HOURS AFTER                          INGESTION AND >50 ug/mL AT 12                          HOURS AFTER INGESTION ARE                          OFTEN ASSOCIATED WITH TOXIC                          REACTIONS.   WBC 06/11/2014 5.5  4.0 - 10.5 K/uL Final   RBC 06/11/2014 4.80  3.87 - 5.11 MIL/uL Final   Hemoglobin 06/11/2014 14.7  12.0 - 15.0 g/dL Final   HCT 06/11/2014 42.9  36.0 - 46.0 % Final   MCV 06/11/2014 89.4  78.0 - 100.0 fL Final   MCH 06/11/2014 30.6  26.0 - 34.0 pg Final   MCHC 06/11/2014 34.3  30.0 - 36.0 g/dL Final   RDW 06/11/2014 12.1  11.5 - 15.5 % Final   Platelets 06/11/2014 220  150 - 400 K/uL Final   Sodium 06/11/2014 139  137 - 147 mEq/L Final   Potassium 06/11/2014 3.7  3.7 - 5.3  mEq/L Final   Chloride 06/11/2014 101  96 - 112 mEq/L Final   CO2 06/11/2014 25  19 - 32 mEq/L Final   Glucose, Bld 06/11/2014 96  70 - 99 mg/dL Final   BUN 06/11/2014 7  6 - 23 mg/dL Final   Creatinine, Ser 06/11/2014 0.86  0.50 - 1.10 mg/dL Final   Calcium 06/11/2014 9.8  8.4 - 10.5 mg/dL Final   Total Protein 06/11/2014 7.8  6.0 - 8.3 g/dL Final   Albumin 06/11/2014 3.9  3.5 - 5.2 g/dL Final   AST 06/11/2014 15  0 - 37 U/L Final   ALT 06/11/2014 9  0 - 35 U/L Final   Alkaline Phosphatase 06/11/2014 58  39 - 117 U/L Final   Total Bilirubin 06/11/2014 0.2 (L)  0.3 - 1.2 mg/dL Final   GFR calc non Af Amer 06/11/2014 >90  >90 mL/min Final   GFR calc Af  Amer 06/11/2014 >90  >90 mL/min Final   Comment: (NOTE)                          The eGFR has been calculated using the CKD EPI equation.                          This calculation has not been validated in all clinical situations.                          eGFR's persistently <90 mL/min signify possible Chronic Kidney                          Disease.   Anion gap 06/11/2014 13  5 - 15 Final   Alcohol, Ethyl (B) 06/11/2014 <11  0 - 11 mg/dL Final   Comment:                                 LOWEST DETECTABLE LIMIT FOR                          SERUM ALCOHOL IS 11 mg/dL                          FOR MEDICAL PURPOSES ONLY   Salicylate Lvl 10/93/2355 <2.0 (L)  2.8 - 20.0 mg/dL Final   Opiates 06/11/2014 NONE DETECTED  NONE DETECTED Final   Cocaine 06/11/2014 NONE DETECTED  NONE DETECTED Final   Benzodiazepines 06/11/2014 NONE DETECTED  NONE DETECTED Final   Amphetamines 06/11/2014 NONE DETECTED  NONE DETECTED Final   Tetrahydrocannabinol 06/11/2014 NONE DETECTED  NONE DETECTED Final   Barbiturates 06/11/2014 NONE DETECTED  NONE DETECTED Final   Comment:                                 DRUG SCREEN FOR MEDICAL PURPOSES                          ONLY.  IF CONFIRMATION IS NEEDED                          FOR ANY PURPOSE, NOTIFY LAB                           WITHIN 5 DAYS.                                                          LOWEST DETECTABLE LIMITS                          FOR URINE DRUG SCREEN                          Drug Class       Cutoff (ng/mL)  Amphetamine      1000                          Barbiturate      200                          Benzodiazepine   200                          Tricyclics       614                          Opiates          300                          Cocaine          300                          THC              50   Preg Test, Ur 06/11/2014 NEGATIVE  NEGATIVE Final   Comment:                                 THE SENSITIVITY OF THIS                          METHODOLOGY IS >24 mIU/mL    Allergies: Sulfa antibiotics  PTA Medications: (Not in a hospital admission)   Medical Decision Making  -Inpatient admission-under review at [facility] behavioral health Accepted to [facility] -[phone] after 9:00 am on 07/10/2021    Recommendations  Based on my evaluation the patient appears to have an emergency medical condition for which I recommend the patient be transferred to the emergency department for further evaluation.  Derrill Center, NP 07/09/21  5:18 PM

## 2021-07-09 NOTE — ED Notes (Signed)
Pt sitting in her bed calm and cooperative. No c/o pain or distress. A & O x 4. Will continue to monitor for safety

## 2021-07-09 NOTE — ED Provider Notes (Signed)
Patient has been accepted to [facility] behavioral health 407 bed 1 on 07/10/2021 after 9:00 AM

## 2021-07-09 NOTE — ED Notes (Signed)
Patient arrived on unit.

## 2021-07-09 NOTE — Discharge Instructions (Addendum)
Patient accepted to [facility]  

## 2021-07-10 ENCOUNTER — Encounter (HOSPITAL_COMMUNITY): Payer: Self-pay | Admitting: Family

## 2021-07-10 ENCOUNTER — Other Ambulatory Visit: Payer: Self-pay

## 2021-07-10 ENCOUNTER — Inpatient Hospital Stay (HOSPITAL_COMMUNITY)
Admission: AD | Admit: 2021-07-10 | Discharge: 2021-07-15 | DRG: 885 | Disposition: A | Discharge status: Home / Self Care | Payer: Federal, State, Local not specified - Other | Source: Intra-hospital | Attending: Psychiatry | Admitting: Psychiatry

## 2021-07-10 DIAGNOSIS — R45851 Suicidal ideations: Secondary | ICD-10-CM | POA: Diagnosis present

## 2021-07-10 DIAGNOSIS — Z9151 Personal history of suicidal behavior: Secondary | ICD-10-CM | POA: Diagnosis not present

## 2021-07-10 DIAGNOSIS — F332 Major depressive disorder, recurrent severe without psychotic features: Principal | ICD-10-CM | POA: Diagnosis present

## 2021-07-10 DIAGNOSIS — Z560 Unemployment, unspecified: Secondary | ICD-10-CM

## 2021-07-10 DIAGNOSIS — Z23 Encounter for immunization: Secondary | ICD-10-CM | POA: Diagnosis not present

## 2021-07-10 DIAGNOSIS — F5105 Insomnia due to other mental disorder: Secondary | ICD-10-CM | POA: Diagnosis present

## 2021-07-10 MED ORDER — MAGNESIUM HYDROXIDE 400 MG/5ML PO SUSP: 30.0000 mL | Freq: Every day | ORAL | Status: DC | PRN

## 2021-07-10 MED ORDER — HYDROXYZINE HCL 25 MG PO TABS
25.0000 mg | ORAL_TABLET | Freq: Three times a day (TID) | ORAL | Status: DC | PRN
  Administered 2021-07-10 – 2021-07-13 (×2): 25 mg via ORAL
  Filled 2021-07-10 (×3): qty 1

## 2021-07-10 MED ORDER — TRAZODONE HCL 50 MG PO TABS
50.0000 mg | ORAL_TABLET | Freq: Every evening | ORAL | Status: DC | PRN
  Administered 2021-07-10 – 2021-07-11 (×2): 50 mg via ORAL
  Filled 2021-07-10 (×2): qty 1

## 2021-07-10 MED ORDER — MIRTAZAPINE 15 MG PO TABS
15.0000 mg | ORAL_TABLET | Freq: Every day | ORAL | Status: DC
  Administered 2021-07-10: 22:00:00 15 mg via ORAL
  Filled 2021-07-10 (×5): qty 1

## 2021-07-10 MED ORDER — TRAZODONE HCL 50 MG PO TABS: 50.0000 mg | ORAL_TABLET | Freq: Every evening | ORAL | Status: DC | PRN

## 2021-07-10 MED ORDER — INFLUENZA VAC SPLIT QUAD 0.5 ML IM SUSY
0.5000 mL | PREFILLED_SYRINGE | INTRAMUSCULAR | Status: AC
  Administered 2021-07-11: 14:00:00 0.5 mL via INTRAMUSCULAR
  Filled 2021-07-10: qty 0.5

## 2021-07-10 MED ORDER — VENLAFAXINE HCL ER 75 MG PO CP24
75.0000 mg | ORAL_CAPSULE | Freq: Every day | ORAL | Status: DC
  Administered 2021-07-11 – 2021-07-15 (×5): 75 mg via ORAL
  Filled 2021-07-10 (×9): qty 1

## 2021-07-10 MED ORDER — ACETAMINOPHEN 325 MG PO TABS: 650.0000 mg | ORAL_TABLET | Freq: Four times a day (QID) | ORAL | Status: DC | PRN

## 2021-07-10 MED ORDER — ALUM & MAG HYDROXIDE-SIMETH 200-200-20 MG/5ML PO SUSP: 30.0000 mL | ORAL | Status: DC | PRN

## 2021-07-10 NOTE — ED Provider Notes (Addendum)
[facility]BC/OBS ASAP Discharge Summary  Date and Time: 07/10/2021 8:52 AM  Name: Stacey Travis  MRN:  [id number]   Discharge Diagnoses:  Final diagnoses:  Severe episode of recurrent major depressive disorder, without psychotic features [facility])    Subjective: Stacey Travis is a 30 year old African-American female who presents to [facility] urgent care accompanied by her aunt. Reports worsening depression and suicidal ideations. Per patient on patient wrote a suicide note." Saying her goodbyes to the family" Patients aunt stated that that patient has a history with Asperger.    Stay Summary: Patient seen and evaluated face to face, chart reviewed and cased discussed with Dr. Gasper Sells. Patient is alert and oriented x 4. Her thought process is logical and speech is clear and coherent. Her mood is dysphoric and affect is congruent.   Patient patient denies current suicidal ideations. She states that yesterday she had a talk with her family and at that time she presented them with a suicide note she wrote. She appears to be guarded and withdrawn and does not provide further details as to whether or not she had a suicide plan and intent at that time. She states that she has a lot of things going on and does not have the time to explain them all. She states that she has been depressed since 2015. She states that she was in grad school in 2016 and failed out of her master's program at [employer]. She states she's lived with four different partners in 4 different apartments over the years. She states that she is technically not homeless and for the past two months she has been living amongst family members and her boyfriend's place.   She reports feeling depressed since 2015 and describes her depressive symptoms most recently as feeling angry, pessimistic, wanting to be alone, sadness, guilty, irritability, and frequent crying spells. She states that she was prescribed Wellbutrin and BuSpar 2015, but stopped  taking her medications in 2016. She states that she has been unable to keep a job due to her depression. 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 Patient denies AVH.  Patient does not appear to be responding to internal or external stimuli. She reports a poor appetite, little to none. She reports difficulty sleeping and states that she usually tosses and turns at night.  She reports daily marijuana use, on average 1 joint per day. She reports smoking since 2016.  Total Time spent with patient: 20 minutes  Past Psychiatric History: MDD Past Medical History: History reviewed. No pertinent past medical history. History reviewed. No pertinent surgical history. Family History: History reviewed. No pertinent family history. Family Psychiatric History: None reported  Social History:  Social History   Substance and Sexual Activity  Alcohol Use Yes   Comment: socially     Social History   Substance and Sexual Activity  Drug Use No    Social History   Socioeconomic History   Marital status: Single    Spouse name: Not on file   Number of children: Not on file   Years of education: Not on file   Highest education level: Not on file  Occupational History   Not on file  Tobacco Use   Smoking status: Never   Smokeless tobacco: Not on file  Substance and Sexual Activity   Alcohol use: Yes  Comment: socially   Drug use: No   Sexual activity: Yes    Birth control/protection: Condom, Implant  Other Topics Concern   Not on file  Social History Narrative   Not on file   Social Determinants of Health   Financial Resource Strain: Not on file  Food Insecurity: Not on file  Transportation Needs: Not on file  Physical Activity: Not on file  Stress: Not on file  Social Connections: Not on file   SDOH:  SDOH Screenings   Alcohol Screen: Not on file   Depression (PHQ2-9): Medium Risk   PHQ-2 Score: 19  Financial Resource Strain: Not on file  Food Insecurity: Not on file  Housing: Not on file  Physical Activity: Not on file  Social Connections: Not on file  Stress: Not on file  Tobacco Use: Unknown   Smoking Tobacco Use: Never   Smokeless Tobacco Use: Unknown   Passive Exposure: Not on file  Transportation Needs: Not on file    Tobacco Cessation:  N/A, patient does not currently use tobacco products  Current Medications:  Current Facility-Administered Medications  Medication Dose Route Frequency Provider Last Rate Last Admin   acetaminophen (TYLENOL) tablet 650 mg  650 mg Oral Q6H PRN Oneta Rack, NP       alum & mag hydroxide-simeth (MAALOX/MYLANTA) 200-200-20 MG/5ML suspension 30 mL  30 mL Oral Q4H PRN Oneta Rack, NP       hydrOXYzine (ATARAX/VISTARIL) tablet 25 mg  25 mg Oral TID PRN Oneta Rack, NP   25 mg at 07/09/21 2109   magnesium hydroxide (MILK OF MAGNESIA) suspension 30 mL  30 mL Oral Daily PRN Oneta Rack, NP       traZODone (DESYREL) tablet 50 mg  50 mg Oral QHS PRN Nwoko, Uchenna E, PA   50 mg at 07/09/21 2156   Current Outpatient Medications  Medication Sig Dispense Refill   buPROPion (WELLBUTRIN XL) 150 MG 24 hr tablet Take 1 tablet (150 mg total) by mouth every morning. 30 tablet 0   busPIRone (BUSPAR) 5 MG tablet Take 1 tablet (5 mg total) by mouth 2 (two) times daily. 60 tablet 0   levonorgestrel (MIRENA, 52 MG,) 20 MCG/DAY IUD 1 Intra Uterine Device by Intrauterine route once.      PTA Medications: (Not in a hospital admission)   Musculoskeletal  Strength & Muscle Tone: within normal limits Gait & Station: normal Patient leans: N/A  Psychiatric Specialty Exam  Presentation  General Appearance: Appropriate for Environment  Eye Contact:Good  Speech:Clear and Coherent  Speech Volume:Normal  Handedness:Right   Mood and Affect  Mood:Depressed  Affect:Congruent   Thought  Process  Thought Processes:Coherent  Descriptions of Associations:Intact  Orientation:Full (Time, Place and Person)  Thought Content:Logical  Diagnosis of Schizophrenia or Schizoaffective disorder in past: No    Hallucinations:Hallucinations: None  Ideas of Reference:None  Suicidal Thoughts:Suicidal Thoughts: No SI Passive Intent and/or Plan: Without Plan  Homicidal Thoughts:Homicidal Thoughts: No   Sensorium  Memory:Immediate Fair; Remote Fair; Recent Fair  Judgment:Impaired  Insight:Lacking   Executive Functions  Concentration:Fair  Attention Span:Fair  Recall:Fair  Fund of Knowledge:Fair  Language:Fair   Psychomotor Activity  Psychomotor Activity:Psychomotor Activity: Normal   Assets  Assets:Communication Skills; Desire for Improvement; Social Support; Physical Health; Leisure Time; Transportation   Sleep  Sleep:Sleep: Poor   Nutritional Assessment (For OBS and [facility] admissions only) Has the patient had a weight loss or gain of 10 pounds or more in the last 3  months?: No Has the patient had a decrease in food intake/or appetite?: No Does the patient have dental problems?: No Does the patient have eating habits or behaviors that may be indicators of an eating disorder including binging or inducing vomiting?: No Has the patient recently lost weight without trying?: 0    Physical Exam  Physical Exam Constitutional:      Appearance: Normal appearance.  HENT:     Head: Normocephalic and atraumatic.     Nose: Nose normal.  Eyes:     Pupils: Pupils are equal, round, and reactive to light.  Cardiovascular:     Rate and Rhythm: Normal rate.  Pulmonary:     Effort: Pulmonary effort is normal.  Musculoskeletal:        General: Normal range of motion.     Cervical back: Normal range of motion.  Neurological:     Mental Status: She is alert and oriented to person, place, and time.   Review of Systems  Constitutional: Negative.   HENT: Negative.     Eyes: Negative.   Respiratory: Negative.    Cardiovascular: Negative.   Gastrointestinal: Negative.   Genitourinary: Negative.   Musculoskeletal: Negative.   Skin: Negative.   Neurological: Negative.   Endo/Heme/Allergies: Negative.   Blood pressure 124/62, pulse 78, temperature 99.4 F (37.4 C), resp. rate 16, SpO2 99 %. There is no height or weight on file to calculate BMI.  Plan Of Care/Follow-up recommendations:  Activity:  as tolerated  Disposition: Patient accepted to [facility] by Delorise Jackson, [facility] to bed 407. Patient is voluntary. EMTALA completed. Admission orders placed.   Harvest Stanco L, NP 07/10/2021, 8:52 AM
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
## 2021-07-10 NOTE — ED Notes (Signed)
Report called to Valerie RN at [facility]. 

## 2021-07-10 NOTE — Progress Notes (Signed)
[facility] Group Notes:  (Nursing/MHT/Case Management/Adjunct)  Date:  07/10/2021  Time:  2015  Type of Therapy:   wrap up group  Participation Level:  Active  Participation Quality:  Appropriate, Attentive, Sharing, and Supportive  Affect:  Appropriate  Cognitive:  Alert  Insight:  Improving  Engagement in Group:  Engaged  Modes of Intervention:  Clarification, Socialization, and Support  Summary of Progress/Problems: Positive thinking and self-care were discussed.   Marcille Buffy 07/10/2021, 9:55 PM

## 2021-07-10 NOTE — [facility] Group Notes (Signed)
Adult Psychoeducational Group Not Date:  07/10/2021 Time:  0900-1045 Group Topic/Focus: PROGRESSIVE RELAXATION. A group where deep breathing is taught and tensing and relaxation muscle groups is used. Imagery is used as well.  Pts are asked to imagine 3 pillars that hold them up when they are not able to hold themselves up.  Participation Level:  Active  Participation Quality:  Appropriate  Affect:  Appropriate  Cognitive:  Oriented  Insight: Improving  Engagement in Group:  Engaged  Modes of Intervention:  Activity, Discussion, Education, and Support  Additional Comments:  pt rates her energy at a 9/10. States her faith, her family, hold her up, when she is not able to hold herself up  Stacey Travis A
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
## 2021-07-10 NOTE — ED Notes (Signed)
Discharge instructions provided and Pt stated understanding. Pt alert, orient and ambulatory prior to d/c from facility. No personal belongings to be returned from a locker. Report previously called to [facility] at [facility]. Safe transport called for transportation services. Pt escorted to the sally port. Safety maintained.

## 2021-07-10 NOTE — Progress Notes (Signed)
Patient is a 30 year old female who presented initially to the [facility] accompanied by her aunt for increased depression and SI. Pt reportedly had said her good-byes in a note written to her family. Pt reported ongoing SI for several years, and had some "failed attempts". Pt was unable to identify any particular stressors, other than she has been homeless, living with various family members. Patient presented with a sad affect and depressed mood- calm, cooperative, polite behavior- answering questions logically and coherently throughout admission interview and assessment. Pt denied SI/HI and A/VH, and did not appear to be responding to internal stimuli. VS monitored and recorded. Skin assessment revealed no abnormalities. Pt had no belongings except for the clothes that she was wearing.  Patient was oriented to unit and schedule.Q 15 minutes checks initiated for safety.

## 2021-07-10 NOTE — ED Notes (Signed)
Safe transport called for services to be provided to [facility].

## 2021-07-10 NOTE — ED Notes (Signed)
Pt sleeping@this time. Breathing even and unlabored. Will continue to monitor for safety 

## 2021-07-10 NOTE — Tx Team (Signed)
Initial Treatment Plan 07/10/2021 1:55 PM Stacey Travis [id number]    PATIENT STRESSORS: Financial difficulties   Other: housing     PATIENT STRENGTHS: Ability for insight  Average or above average intelligence  Capable of independent living  Printmaker for treatment/growth  Physical Health  Supportive family/friends    PATIENT IDENTIFIED PROBLEMS: Suicidal ideation      Housing instability    hopelessness           DISCHARGE CRITERIA:  Adequate post-discharge living arrangements Improved stabilization in mood, thinking, and/or behavior Reduction of life-threatening or endangering symptoms to within safe limits Verbal commitment to aftercare and medication compliance  PRELIMINARY DISCHARGE PLAN: Outpatient therapy Placement in alternative living arrangements  PATIENT/FAMILY INVOLVEMENT: This treatment plan has been presented to and reviewed with the patient, Stacey Travis,   The patient has been given the opportunity to ask questions and make suggestions.  Shela Nevin, RN 07/10/2021, 1:55 PM

## 2021-07-10 NOTE — Progress Notes (Signed)
EKG results placed on the outside of pt's shadow chart  Normal sinus rhythm Normal ECG  QT/Qtc-Baz  408/427 ms

## 2021-07-11 ENCOUNTER — Encounter (HOSPITAL_COMMUNITY): Payer: Self-pay

## 2021-07-11 DIAGNOSIS — F332 Major depressive disorder, recurrent severe without psychotic features: Secondary | ICD-10-CM | POA: Diagnosis not present

## 2021-07-11 MED ORDER — MIRTAZAPINE 30 MG PO TABS
30.0000 mg | ORAL_TABLET | Freq: Every day | ORAL | Status: DC
  Administered 2021-07-11 – 2021-07-14 (×4): 30 mg via ORAL
  Filled 2021-07-11 (×8): qty 1

## 2021-07-11 NOTE — Progress Notes (Signed)
[facility] MD Progress Note  07/11/2021 11:39 AM Stacey Travis  MRN:  [id number] In short : Patient is a 30 year old female with psychiatric history of MDD and GAD presenting from behavioral health hospital from behavioral health urgent care due to worsening depression and suicidal ideation.  24 EMR reviewed. Patient discussed in progression rounds with treatment team. MAR was reviewed and Pt has been complaint with scheduled medications and required PRN vistaril and trazodone yesterday. Nursing notes indicate that Pt slept for  6.75 hours.  Evaluation on the unit today - Pt is seen and examined today. Pt reports improvement in depression and anxiety. Pt rates her depression at 5/10 on a scale of 1-10 where 10 being severe symptoms.  She states her depression was 8-9 before coming to the hospital. She states she feels very safe here as she has been moving to different places and now she feels safe. She slept well last night.  Pt states her appetite is poor but improving.  She states she ate only bacon this morning in breakfast.  She has been attending some groups.  She states she talked to her best friend yesterday who is a great support for her.  She states her mom visited yesterday which went well and they talked about how she is adjusting here.  She states that she has had passive suicidal ideation for many years. Currently, Pt denies any suicidal ideation, homicidal ideation and, visual and auditory hallucination. He denies any paranoia, delusions and first rank symptoms. Pt denies any headache, nausea, vomiting, dizziness, chest pain, SOB, abdominal pain, diarrhea, and constipation. Pt denies any medication side effects and has been tolerating it well. Pt denies any concerns.     Principal Problem: MDD (major depressive disorder), recurrent severe, without psychosis ([place]) Diagnosis: Principal Problem:   MDD (major depressive disorder), recurrent severe, without psychosis ([place])  Total Time spent with  patient: 20 minutes  Past Psychiatric History: MDD and GAD  Past Medical History: History reviewed. No pertinent past medical history. History reviewed. No pertinent surgical history. Family History: History reviewed. No pertinent family history.  Family Psychiatric  History: none reported Social History:  Social History   Substance and Sexual Activity  Alcohol Use Yes   Comment: socially     Social History   Substance and Sexual Activity  Drug Use No    Social History   Socioeconomic History   Marital status: Single    Spouse name: Not on file   Number of children: Not on file   Years of education: Not on file   Highest education level: Bachelor's degree (e.g., BA, AB, BS)  Occupational History   Not on file  Tobacco Use   Smoking status: Never   Smokeless tobacco: Not on file  Substance and Sexual Activity   Alcohol use: Yes    Comment: socially   Drug use: No   Sexual activity: Yes    Birth control/protection: Condom, Implant  Other Topics Concern   Not on file  Social History Narrative   Not on file   Social Determinants of Health   Financial Resource Strain: Not on file  Food Insecurity: Not on file  Transportation Needs: Not on file  Physical Activity: Not on file  Stress: Not on file  Social Connections: Not on file   Additional Social History:                         Sleep: Good  Appetite:  Good  Current Medications: Current Facility-Administered Medications  Medication Dose Route Frequency Provider Last Rate Last Admin   acetaminophen (TYLENOL) tablet 650 mg  650 mg Oral Q6H PRN White, Patrice L, NP       alum & mag hydroxide-simeth (MAALOX/MYLANTA) 200-200-20 MG/5ML suspension 30 mL  30 mL Oral Q4H PRN White, Patrice L, NP       hydrOXYzine (ATARAX/VISTARIL) tablet 25 mg  25 mg Oral TID PRN White, Patrice L, NP   25 mg at 07/10/21 2149   influenza vac split quadrivalent PF (FLUARIX) injection 0.5 mL  0.5 mL Intramuscular  Tomorrow-1000 Massengill, Nathan, MD       magnesium hydroxide (MILK OF MAGNESIA) suspension 30 mL  30 mL Oral Daily PRN White, Patrice L, NP       mirtazapine (REMERON) tablet 15 mg  15 mg Oral QHS France Ravens, MD   15 mg at 07/10/21 2149   traZODone (DESYREL) tablet 50 mg  50 mg Oral QHS PRN,MR X 1 France Ravens, MD   50 mg at 07/10/21 2148   venlafaxine XR (EFFEXOR-XR) 24 hr capsule 75 mg  75 mg Oral Q breakfast France Ravens, MD        Lab Results:  Results for orders placed or performed during the hospital encounter of 07/09/21 (from the past 48 hour(s))  Resp Panel by RT-PCR (Flu A&B, Covid) Nasopharyngeal Swab     Status: None   Collection Time: 07/09/21  5:25 PM   Specimen: Nasopharyngeal Swab; Nasopharyngeal(NP) swabs in vial transport medium  Result Value Ref Range   SARS Coronavirus 2 by RT PCR NEGATIVE NEGATIVE    Comment: (NOTE) SARS-CoV-2 target nucleic acids are NOT DETECTED.  The SARS-CoV-2 RNA is generally detectable in upper respiratory specimens during the acute phase of infection. The lowest concentration of SARS-CoV-2 viral copies this assay can detect is 138 copies/mL. A negative result does not preclude SARS-Cov-2 infection and should not be used as the sole basis for treatment or other patient management decisions. A negative result may occur with  improper specimen collection/handling, submission of specimen other than nasopharyngeal swab, presence of viral mutation(s) within the areas targeted by this assay, and inadequate number of viral copies(<138 copies/mL). A negative result must be combined with clinical observations, patient history, and epidemiological information. The expected result is Negative.  Fact Sheet for Patients:  EntrepreneurPulse.com.au  Fact Sheet for Healthcare Providers:  IncredibleEmployment.be  This test is no t yet approved or cleared by the [place] FDA and  has been authorized for detection  and/or diagnosis of SARS-CoV-2 by FDA under an Emergency Use Authorization (EUA). This EUA will remain  in effect (meaning this test can be used) for the duration of the COVID-19 declaration under Section 564(b)(1) of the Act, 21 U.S.C.section 360bbb-3(b)(1), unless the authorization is terminated  or revoked sooner.       Influenza A by PCR NEGATIVE NEGATIVE   Influenza B by PCR NEGATIVE NEGATIVE    Comment: (NOTE) The Xpert Xpress SARS-CoV-2/FLU/RSV plus assay is intended as an aid in the diagnosis of influenza from Nasopharyngeal swab specimens and should not be used as a sole basis for treatment. Nasal washings and aspirates are unacceptable for Xpert Xpress SARS-CoV-2/FLU/RSV testing.  Fact Sheet for Patients: EntrepreneurPulse.com.au  Fact Sheet for Healthcare Providers: IncredibleEmployment.be  This test is not yet approved or cleared by the [place] FDA and has been authorized for detection and/or diagnosis of SARS-CoV-2 by FDA under an Emergency Use Authorization (EUA). This EUA  will remain in effect (meaning this test can be used) for the duration of the COVID-19 declaration under Section 564(b)(1) of the Act, 21 U.S.C. section 360bbb-3(b)(1), unless the authorization is terminated or revoked.  Performed at [facility], [place] [address], [place], [place] [address]   POC SARS Coronavirus 2 Ag-ED - Nasal Swab     Status: None   Collection Time: 07/09/21  5:28 PM  Result Value Ref Range   SARS Coronavirus 2 Ag Negative Negative  POCT Urine Drug Screen - ([facility])     Status: Abnormal   Collection Time: 07/09/21  5:28 PM  Result Value Ref Range   POC Amphetamine UR None Detected NONE DETECTED (Cut Off Level 1000 ng/mL)   POC Secobarbital (BAR) None Detected NONE DETECTED (Cut Off Level 300 ng/mL)   POC Buprenorphine (BUP) None Detected NONE DETECTED (Cut Off Level 10 ng/mL)   POC Oxazepam (BZO) None Detected NONE DETECTED  (Cut Off Level 300 ng/mL)   POC Cocaine UR None Detected NONE DETECTED (Cut Off Level 300 ng/mL)   POC Methamphetamine UR None Detected NONE DETECTED (Cut Off Level 1000 ng/mL)   POC Morphine None Detected NONE DETECTED (Cut Off Level 300 ng/mL)   POC Oxycodone UR None Detected NONE DETECTED (Cut Off Level 100 ng/mL)   POC Methadone UR None Detected NONE DETECTED (Cut Off Level 300 ng/mL)   POC Marijuana UR Positive (A) NONE DETECTED (Cut Off Level 50 ng/mL)  Pregnancy, urine POC     Status: None   Collection Time: 07/09/21  5:39 PM  Result Value Ref Range   Preg Test, Ur NEGATIVE NEGATIVE    Comment:        THE SENSITIVITY OF THIS METHODOLOGY IS >24 mIU/mL   CBC with Differential     Status: None   Collection Time: 07/09/21  5:48 PM  Result Value Ref Range   WBC 5.1 4.0 - 10.5 K/uL   RBC 4.83 3.87 - 5.11 MIL/uL   Hemoglobin 14.8 12.0 - 15.0 g/dL   HCT 45.0 36.0 - 46.0 %   MCV 93.2 80.0 - 100.0 fL   MCH 30.6 26.0 - 34.0 pg   MCHC 32.9 30.0 - 36.0 g/dL   RDW 12.3 11.5 - 15.5 %   Platelets 217 150 - 400 K/uL   nRBC 0.0 0.0 - 0.2 %   Neutrophils Relative % 51 %   Neutro Abs 2.6 1.7 - 7.7 K/uL   Lymphocytes Relative 37 %   Lymphs Abs 1.9 0.7 - 4.0 K/uL   Monocytes Relative 10 %   Monocytes Absolute 0.5 0.1 - 1.0 K/uL   Eosinophils Relative 1 %   Eosinophils Absolute 0.1 0.0 - 0.5 K/uL   Basophils Relative 1 %   Basophils Absolute 0.0 0.0 - 0.1 K/uL   Immature Granulocytes 0 %   Abs Immature Granulocytes 0.01 0.00 - 0.07 K/uL    Comment: Performed at [facility], [address], [place], [place] [address]  Comprehensive metabolic panel     Status: None   Collection Time: 07/09/21  5:48 PM  Result Value Ref Range   Sodium 139 135 - 145 mmol/L   Potassium 4.2 3.5 - 5.1 mmol/L   Chloride 103 98 - 111 mmol/L   CO2 27 22 - 32 mmol/L   Glucose, Bld 91 70 - 99 mg/dL    Comment: Glucose reference range applies only to samples taken after fasting for at least 8 hours.   BUN  10 6 - 20 mg/dL   Creatinine, Ser 1.61 0.44 - 1.00 mg/dL   Calcium 09.6 8.9 - 04.5 mg/dL   Total Protein 7.4 6.5 - 8.1 g/dL   Albumin 4.6 3.5 - 5.0 g/dL   AST 16 15 - 41 U/L   ALT 11 0 - 44 U/L   Alkaline Phosphatase 54 38 - 126 U/L   Total Bilirubin 0.9 0.3 - 1.2 mg/dL   GFR, Estimated >40 >98 mL/min    Comment: (NOTE) Calculated using the CKD-EPI Creatinine Equation (2021)    Anion gap 9 5 - 15    Comment: Performed at [facility], [address], [place], [place] [address]  TSH     Status: None   Collection Time: 07/09/21  5:48 PM  Result Value Ref Range   TSH 1.764 0.350 - 4.500 uIU/mL    Comment: Performed by a 3rd Generation assay with a functional sensitivity of <=0.01 uIU/mL. Performed at [facility], [address], [place], [place] [address]     Blood Alcohol level:  Lab Results  Component Value Date   [facility] <11 06/11/2014    Metabolic Disorder Labs: No results found for: HGBA1C, MPG No results found for: PROLACTIN No results found for: CHOL, TRIG, HDL, CHOLHDL, VLDL, LDLCALC  Physical Findings: AIMS:  , ,  ,  ,    CIWA:    COWS:     Musculoskeletal: Strength & Muscle Tone: within normal limits Gait & Station: normal Patient leans: N/A  Psychiatric Specialty Exam:  Presentation  General Appearance: Appropriate for Environment  Eye Contact:Good  Speech:Clear and Coherent  Speech Volume:Normal  Handedness:Right   Mood and Affect  Mood:Anxious; Depressed  Affect:Depressed; Congruent   Thought Process  Thought Processes:Coherent; Goal Directed  Descriptions of Associations:Intact  Orientation:Full (Time, Place and Person)  Thought Content:Logical  History of Schizophrenia/Schizoaffective disorder:No  Duration of Psychotic Symptoms:No data recorded Hallucinations:Hallucinations: None  Ideas of Reference:None  Suicidal Thoughts:Suicidal Thoughts: No SI Passive Intent and/or Plan: Without Intent; Without  Plan  Homicidal Thoughts:Homicidal Thoughts: No   Sensorium  Memory:Immediate Fair; Recent Fair; Remote Fair  Judgment:Poor  Insight:Fair   Executive Functions  Concentration:Good  Attention Span:Good  Recall:Fair  Fund of Knowledge:Good  Language:Good   Psychomotor Activity  Psychomotor Activity:Psychomotor Activity: Normal   Assets  Assets:Communication Skills; Desire for Improvement; Social Support; Physical Health; Resilience; Leisure Time; Transportation   Sleep  Sleep:Sleep: Good Number of Hours of Sleep: 6.75    Physical Exam: Physical Exam Vitals and nursing note reviewed.  Constitutional:      General: She is not in acute distress.    Appearance: Normal appearance. She is not ill-appearing, toxic-appearing or diaphoretic.  HENT:     Head: Normocephalic and atraumatic.  Pulmonary:     Effort: Pulmonary effort is normal.  Musculoskeletal:        General: Normal range of motion.  Neurological:     General: No focal deficit present.     Mental Status: She is alert and oriented to person, place, and time.   Review of Systems  Constitutional:  Negative for fever.  HENT:  Negative for hearing loss.   Eyes:  Negative for blurred vision.  Respiratory:  Negative for cough and shortness of breath.   Cardiovascular:  Negative for chest pain.  Gastrointestinal:  Negative for abdominal pain, diarrhea, nausea and vomiting.  Neurological:  Negative for dizziness, focal weakness and headaches.  Psychiatric/Behavioral:  Positive for depression. Negative for hallucinations  and suicidal ideas. The patient is nervous/anxious and has insomnia.   Blood pressure 111/81, pulse 99, temperature 98.3 F (36.8 C), temperature source Oral, resp. rate 18, height 5\' 4"  (1.626 m), weight 58.5 kg, SpO2 100 %. Body mass index is 22.14 kg/m.   Treatment Plan Summary: Patient is a 30 year old female with history of depression anxiety presenting from [facility] to [facility] due to  worsening depression and suicidal ideation.    Safety and Monitoring: Pt need continued inpatient admission to psychiatric unit for safety, stabilization and treatment Daily contact with patient to assess and evaluate symptoms and progress in treatment Patient's case to be discussed in multi-disciplinary team meeting Observation Level : q15 minute checks Vital signs: q12 hours Precautions: suicide, elopement, and assault  Psychiatric Problems Major depressive disorder-severe, recurrent episode without psychosis Generalized anxiety disorder Cannabis use disorder-moderate -Continue Remeron 15 mg nightly for depressive symptoms, anxiety, insomnia and appetite stimulation (Started on 07/10/21) -Continue Effexor 75 daily for depressive symptoms and anxiety (START 07/11/2021)   PRNs Tylenol 650 mg for mild pain Maalox/Mylanta 30 mL for indigestion Hydroxyzine 25 mg tid for anxiety Milk of Magnesia 30 mL for constipation Trazodone 50 mg for sleep   Discharge Planning: Social work and case management to assist with discharge planning and identification of hospital follow-up needs prior to discharge Estimated LOS: 5-7 days Discharge Concerns: Need to establish a safety plan; Medication compliance and effectiveness Discharge Goals: Return home with outpatient referrals for mental health follow-up including medication management/psychotherapy   Armando Reichert, MD PGy2 07/11/2021, 11:39 AM

## 2021-07-11 NOTE — Hospital Course (Addendum)
Stacey Travis is a 30 year old female with past history of MDD, GAD came from [facility] for worsening depression and SI.  She wrote 3 suicidal notes and gave 1 note to her parents.  Multiple stressors including losing home in September, losing grandfather in the same month, being kicked out of boyfriend's house, house instability.  Daily THC use, unemployed.  She is doing better on Remeron 15 mg nightly and Effexor 75 mg daily. Plan to discharge on Friday

## 2021-07-11 NOTE — B[facility] Group Notes (Signed)
Spiritual care group on grief and loss facilitated by chaplain Dyanne Carrel, [facility]   Group Goal:   Support / Education around grief and loss   Members engage in facilitated group support and psycho-social education.   Group Description:   Following introductions and group rules, group members engaged in facilitated group dialog and support around topic of loss, with particular support around experiences of loss in their lives. Group Identified types of loss (relationships / self / things) and identified patterns, circumstances, and changes that precipitate losses. Reflected on thoughts / feelings around loss, normalized grief responses, and recognized variety in grief experience. Group noted Worden's four tasks of grief in discussion.   Group drew on Adlerian / Rogerian, narrative, MI,   Patient Progress: Stacey Travis attended group and participated in conversation.  She shared some of her own coping strategies and about the loss of a family member.  She also showed engagement in the way she was attentive to peers.  Chaplain Dyanne Carrel, [facility] Pager, [phone] 9:40 PM

## 2021-07-11 NOTE — BH IP Treatment Plan (Signed)
Interdisciplinary Treatment and Diagnostic Plan Update  07/11/2021 Time of Session: 10:35am Stacey Travis MRN: [id number]  Principal Diagnosis: MDD (major depressive disorder), recurrent severe, without psychosis ([facility])  Secondary Diagnoses: Principal Problem:   MDD (major depressive disorder), recurrent severe, without psychosis ([facility])   Current Medications:  Current Facility-Administered Medications  Medication Dose Route Frequency Provider Last Rate Last Admin   acetaminophen (TYLENOL) tablet 650 mg  650 mg Oral Q6H PRN White, Patrice L, NP       alum & mag hydroxide-simeth (MAALOX/MYLANTA) 200-200-20 MG/5ML suspension 30 mL  30 mL Oral Q4H PRN White, Patrice L, NP       hydrOXYzine (ATARAX/VISTARIL) tablet 25 mg  25 mg Oral TID PRN White, Patrice L, NP   25 mg at 07/10/21 2149   influenza vac split quadrivalent PF (FLUARIX) injection 0.5 mL  0.5 mL Intramuscular Tomorrow-1000 Massengill, Nathan, MD       magnesium hydroxide (MILK OF MAGNESIA) suspension 30 mL  30 mL Oral Daily PRN White, Patrice L, NP       mirtazapine (REMERON) tablet 15 mg  15 mg Oral QHS Park Pope, MD   15 mg at 07/10/21 2149   traZODone (DESYREL) tablet 50 mg  50 mg Oral QHS PRN,MR X 1 Park Pope, MD   50 mg at 07/10/21 2148   venlafaxine XR (EFFEXOR-XR) 24 hr capsule 75 mg  75 mg Oral Q breakfast Park Pope, MD       PTA Medications: Medications Prior to Admission  Medication Sig Dispense Refill Last Dose   levonorgestrel (MIRENA, 52 MG,) 20 MCG/DAY IUD 1 Intra Uterine Device by Intrauterine route once.       Patient Stressors: Financial difficulties   Other: housing    Patient Strengths: Ability for insight  Average or above average intelligence  Capable of independent living  Printmaker for treatment/growth  Physical Health  Supportive family/friends   Treatment Modalities: Medication Management, Group therapy, Case management,  1 to 1 session with clinician,  Psychoeducation, Recreational therapy.   Physician Treatment Plan for Primary Diagnosis: MDD (major depressive disorder), recurrent severe, without psychosis (HCC) Long Term Goal(s): Improvement in symptoms so as ready for discharge   Short Term Goals: Ability to identify changes in lifestyle to reduce recurrence of condition will improve Ability to verbalize feelings will improve Ability to disclose and discuss suicidal ideas Ability to demonstrate self-control will improve Ability to identify and develop effective coping behaviors will improve Ability to maintain clinical measurements within normal limits will improve Compliance with prescribed medications will improve Ability to identify triggers associated with substance abuse/mental health issues will improve  Medication Management: Evaluate patient's response, side effects, and tolerance of medication regimen.  Therapeutic Interventions: 1 to 1 sessions, Unit Group sessions and Medication administration.  Evaluation of Outcomes: Progressing  Physician Treatment Plan for Secondary Diagnosis: Principal Problem:   MDD (major depressive disorder), recurrent severe, without psychosis (HCC)  Long Term Goal(s): Improvement in symptoms so as ready for discharge   Short Term Goals: Ability to identify changes in lifestyle to reduce recurrence of condition will improve Ability to verbalize feelings will improve Ability to disclose and discuss suicidal ideas Ability to demonstrate self-control will improve Ability to identify and develop effective coping behaviors will improve Ability to maintain clinical measurements within normal limits will improve Compliance with prescribed medications will improve Ability to identify triggers associated with substance abuse/mental health issues will improve     Medication Management: Evaluate patient's response, side  effects, and tolerance of medication regimen.  Therapeutic Interventions: 1 to 1  sessions, Unit Group sessions and Medication administration.  Evaluation of Outcomes: Progressing   RN Treatment Plan for Primary Diagnosis: MDD (major depressive disorder), recurrent severe, without psychosis (HCC) Long Term Goal(s): Knowledge of disease and therapeutic regimen to maintain health will improve  Short Term Goals: Ability to remain free from injury will improve, Ability to verbalize frustration and anger appropriately will improve, Ability to demonstrate self-control, Ability to participate in decision making will improve, Ability to verbalize feelings will improve, Ability to disclose and discuss suicidal ideas, Ability to identify and develop effective coping behaviors will improve, and Compliance with prescribed medications will improve  Medication Management: RN will administer medications as ordered by provider, will assess and evaluate patient's response and provide education to patient for prescribed medication. RN will report any adverse and/or side effects to prescribing provider.  Therapeutic Interventions: 1 on 1 counseling sessions, Psychoeducation, Medication administration, Evaluate responses to treatment, Monitor vital signs and CBGs as ordered, Perform/monitor CIWA, COWS, AIMS and Fall Risk screenings as ordered, Perform wound care treatments as ordered.  Evaluation of Outcomes: Progressing   LCSW Treatment Plan for Primary Diagnosis: MDD (major depressive disorder), recurrent severe, without psychosis (HCC) Long Term Goal(s): Safe transition to appropriate next level of care at discharge, Engage patient in therapeutic group addressing interpersonal concerns.  Short Term Goals: Engage patient in aftercare planning with referrals and resources, Increase social support, Increase ability to appropriately verbalize feelings, Increase emotional regulation, Facilitate acceptance of mental health diagnosis and concerns, Facilitate patient progression through stages of  change regarding substance use diagnoses and concerns, Identify triggers associated with mental health/substance abuse issues, and Increase skills for wellness and recovery  Therapeutic Interventions: Assess for all discharge needs, 1 to 1 time with Social worker, Explore available resources and support systems, Assess for adequacy in community support network, Educate family and significant other(s) on suicide prevention, Complete Psychosocial Assessment, Interpersonal group therapy.  Evaluation of Outcomes: Progressing   Progress in Treatment: Attending groups: Yes. Participating in groups: Yes. Taking medication as prescribed: Yes. Toleration medication: Yes. Family/Significant other contact made: No, will contact:  Mother Patient understands diagnosis: Yes. Discussing patient identified problems/goals with staff: Yes. Medical problems stabilized or resolved: Yes. Denies suicidal/homicidal ideation: Yes. Issues/concerns per patient self-inventory: No.  New problem(s) identified: No, Describe:  none reported  New Short Term/Long Term Goal(s):   medication stabilization, elimination of SI thoughts, development of comprehensive mental wellness plan.    Patient Goals:  Patient states that she would like to be healthy enough to handle the outside world.  Patient discussed wanting to be appropriately connected to resources  Discharge Plan or Barriers:  Patient recently admitted. CSW will continue to follow and assess for appropriate referrals and possible discharge planning.    Reason for Continuation of Hospitalization: Anxiety Depression Medication stabilization Suicidal ideation  Estimated Length of Stay: 3-5 days   Scribe for Treatment Team: Beatris Si, LCSW 07/11/2021 11:00 AM

## 2021-07-11 NOTE — Group Note (Signed)
LCSW Group Therapy Note   Group Date: 07/11/2021 Start Time: 1300 End Time: 1400  Type of Therapy and Topic:  Group Therapy - Healthy vs Unhealthy Coping Skills   Participation Level:  Active    Description of Group The focus of this group was to determine what unhealthy coping techniques typically are used by group members and what healthy coping techniques would be helpful in coping with various problems. Patients were guided in becoming aware of the differences between healthy and unhealthy coping techniques. Patients were asked to identify 2-3 healthy coping skills they would like to learn to use more effectively.   Therapeutic Goals Patients learned that coping is what human beings do all day long to deal with various situations in their lives Patients defined and discussed healthy vs unhealthy coping techniques Patients identified their preferred coping techniques and identified whether these were healthy or unhealthy Patients determined 2-3 healthy coping skills they would like to become more familiar with and use more often. Patients provided support and ideas to each other     Summary of Patient Progress: Patient shared during group that her coping skill prior to admission was running away from or quitting things. Patient proved open to input from peers and feedback from CSW. Patient demonstrated insight into the subject matter, was respectful of peers, and participated throughout the entire session.  Aram Beecham, LCSWA 07/11/2021  2:18 PM

## 2021-07-11 NOTE — Progress Notes (Signed)
Stacey Travis was visible on the unit.  She attended evening wrap up group.  She requested hydroxyzine and trazodone at bedtime. She is currently resting with her eyes closed and appears to be in no physical distress.  Q 15 minute checks maintained for safety.   07/10/21 2149  Psych Admission Type (Psych Patients Only)  Admission Status Voluntary  Psychosocial Assessment  Patient Complaints Depression;Anxiety  Eye Contact Fair  Facial Expression Sad  Affect Sad  Speech Soft  Interaction Assertive  Motor Activity Other (Comment) (wdl)  Appearance/Hygiene Unremarkable  Behavior Characteristics Cooperative;Appropriate to situation;Anxious  Mood Depressed;Anxious  Thought Process  Coherency WDL  Content WDL  Delusions None reported or observed  Perception WDL  Hallucination None reported or observed  Judgment WDL  Confusion None  Danger to Self  Current suicidal ideation? Denies  Danger to Others  Danger to Others None reported or observed

## 2021-07-11 NOTE — [facility] Group Notes (Signed)
Adult Psychoeducational Group Note  Date:  07/11/2021 Time:  1:19 PM  Group Topic/Focus:  Goals Group:   The focus of this group is to help patients establish daily goals to achieve during treatment and discuss how the patient can incorporate goal setting into their daily lives to aide in recovery.  Participation Level:  Active  Participation Quality:  Appropriate  Affect:  Appropriate  Cognitive:  Appropriate  Insight: Appropriate  Engagement in Group:  Engaged  Modes of Intervention:  Discussion  Additional Comments:  Patient attended morning orientation/goal setting group & participated.   [name] W Saloma Cadena 07/11/2021, 1:19 PM

## 2021-07-11 NOTE — Progress Notes (Signed)
Pt rates her anxiety 5/10, depression and hopelessness both 6/10. Denies SI, HI, AVH and pain when assessed this afternoon. Observed to be superficial / minimal on interactions with a fixed smile, animated "I'm fine for now. I'm ok, I don't need anything" when approached for PRN due to anxiety and depression scale this afternoon. Visible in dayroom for scheduled groups this afternoon. Engaged in activities activities. Reports she slept poor last night with poor appetite, low energy and good concentration level.  Safety checks maintained at Q 15 minutes intervals. Support and reassurance offered. All medications administered with verbal education and effects monitored.  Pt tolerates all medications, meals and fluids well. Denies concerns at this time. Remains safe on and off unit.

## 2021-07-11 NOTE — [facility] Group Notes (Signed)
Adult Psychoeducational Group Note  Date:  07/11/2021 Time:  4:42 PM  Group Topic/Focus:  Self Care:   The focus of this group is to help patients understand the importance of self-care in order to improve or restore emotional, physical, spiritual, interpersonal, and financial health.  Participation Level:  Active  Participation Quality:  Appropriate  Affect:  Appropriate  Cognitive:  Appropriate  Insight: Appropriate  Engagement in Group:  Engaged  Modes of Intervention:  Activity  Additional Comments:  Patient attended and participated in the Psycho-Ed group.  Jearl Klinefelter 07/11/2021, 4:42 PM

## 2021-07-11 NOTE — Group Note (Signed)
Occupational Therapy Group Note  Group Topic:Feelings Management  Group Date: 07/11/2021 Start Time: 1400 End Time: 1440 Facilitators: Donne Hazel, OT/L    Group Description:Group encouraged increased engagement and participation through discussion focused on Building Happiness. Patients were provided a handout and reviewed therapeutic strategies to build happiness including identifying gratitudes, random acts of kindness, exercise, meditation, positive journaling, and fostering relationships. Patients engaged in discussion and encouraged to reflect on each strategy and their experiences.  Therapeutic Goal(s): Identify strategies to build happiness. Identify and implement therapeutic strategies to improve overall mood. Practice and identify gratitudes, random acts of kindness, exercise, meditation, positive journaling, and fostering relationships  Participation Level: Active   Participation Quality: Minimal Cues   Behavior: Cooperative and Interactive    Speech/Thought Process: Focused   Affect/Mood: Full range   Insight: Fair   Judgement: Fair   Individualization: Stacey Travis was active in their participation of group discussion/activity. Pt identified "my dog, Boomer" as something that currently brings them happiness. Pt identified "fostering relationships, and acts of kindness" as the activity they could challenge themselves to do for 21 days to build happiness into their life/routine.   Modes of Intervention: Activity, Discussion, and Education  Patient Response to Interventions:  Attentive and Engaged   Plan: Continue to engage patient in OT groups 2 - 3x/week.  07/11/2021  Donne Hazel, OT/L

## 2021-07-11 NOTE — Group Note (Signed)
Recreation Therapy Group Note   Group Topic:Stress Management  Group Date: 07/11/2021 Start Time: 0935 End Time: 0955 Facilitators: Caroll Rancher, LRT,CTRS Location: [address] Dayroom   Goal Area(s) Addresses:  Patient will identify members of their support system. Patient will acknowledge benefit of healthy supports in daily life. Patient will identify any negative relationships in their support system and discuss alternatives.  Patient will verbalize positive effect of healthy supports post d/c.   Group Description:  Meditation.  LRT played a meditation that focused on releasing anxiety through slow controlled breathing, being able to acknowledge thoughts and feelings without acting on them and releasing things beyond your control.    Affect/Mood: Appropriate   Participation Level: Active   Participation Quality: Independent   Behavior: Appropriate   Speech/Thought Process: Focused   Insight: Good   Judgement: Good   Modes of Intervention: Meditation, Nature Sounds   Patient Response to Interventions:  Attentive   Education Outcome:  Acknowledges education and In group clarification offered    Clinical Observations/Individualized Feedback: Pt attended and participated in activity.    Plan: Continue to engage patient in RT group sessions 2-3x/week.   Caroll Rancher, LRT,CTRS 07/11/2021 1:22 PM

## 2021-07-12 ENCOUNTER — Telehealth (HOSPITAL_COMMUNITY): Payer: Self-pay

## 2021-07-12 DIAGNOSIS — B9689 Other specified bacterial agents as the cause of diseases classified elsewhere: Secondary | ICD-10-CM | POA: Insufficient documentation

## 2021-07-12 DIAGNOSIS — N760 Acute vaginitis: Secondary | ICD-10-CM | POA: Insufficient documentation

## 2021-07-12 DIAGNOSIS — N939 Abnormal uterine and vaginal bleeding, unspecified: Secondary | ICD-10-CM | POA: Insufficient documentation

## 2021-07-12 MED ORDER — TRAZODONE HCL 100 MG PO TABS
100.0000 mg | ORAL_TABLET | Freq: Every evening | ORAL | Status: DC | PRN
  Administered 2021-07-12 – 2021-07-14 (×3): 100 mg via ORAL
  Filled 2021-07-12 (×2): qty 1

## 2021-07-12 MED ORDER — TRAZODONE HCL 100 MG PO TABS
ORAL_TABLET | ORAL | Status: AC
  Filled 2021-07-12: qty 1

## 2021-07-12 MED ORDER — MELATONIN 5 MG PO TABS
5.0000 mg | ORAL_TABLET | Freq: Every evening | ORAL | Status: DC | PRN
  Administered 2021-07-14: 5 mg via ORAL
  Filled 2021-07-12: qty 1

## 2021-07-12 NOTE — BH Assessment (Signed)
Care Management - Follow Up Discharges   Patient has been placed in an inpatient psychiatric hospital ([facility]) on 07-10-2021 

## 2021-07-12 NOTE — Progress Notes (Signed)
Stacey Travis was in the day room all evening playing cards with her peers.  She denied SI/HI or AVH.  She reported that her day was better because she wasn't as agitated.  She took her increased dosage of bedtime medications without difficulty.  She is currently resting with her eyes closed and appears to be asleep.  Q 15 minute checks maintained for safety.     07/12/21 2108  Psych Admission Type (Psych Patients Only)  Admission Status Voluntary  Psychosocial Assessment  Patient Complaints Anxiety;Insomnia  Eye Contact Fair  Facial Expression Animated;Anxious  Affect Anxious;Appropriate to circumstance  Speech Logical/coherent  Interaction Assertive  Motor Activity Slow  Appearance/Hygiene Unremarkable  Behavior Characteristics Cooperative;Appropriate to situation  Mood Anxious;Pleasant  Thought Process  Coherency Concrete thinking  Content WDL  Delusions WDL  Perception WDL  Hallucination None reported or observed  Judgment Poor  Confusion None  Danger to Self  Current suicidal ideation? Denies  Danger to Others  Danger to Others None reported or observed

## 2021-07-12 NOTE — Progress Notes (Signed)
Stacey Travis has been up and visible on the unit.  She has been interacting well with peers.  She denied SI/HI or AVH.  She reported her main concern was being able to sleep this evening.  She was given scheduled increase in Remeron as well as prn trazodone.  She is currently resting with her eyes closed and appears to be asleep.   07/11/21 2215  Psych Admission Type (Psych Patients Only)  Admission Status Voluntary  Psychosocial Assessment  Patient Complaints Anxiety;Insomnia  Eye Contact Fair  Facial Expression Fixed smile  Affect Appropriate to circumstance  Speech Soft;Logical/coherent  Interaction Assertive  Motor Activity Other (Comment) (unremarkable)  Appearance/Hygiene Unremarkable  Behavior Characteristics Cooperative;Appropriate to situation  Mood Anxious  Thought Process  Coherency WDL  Content WDL  Delusions None reported or observed  Perception WDL  Hallucination None reported or observed  Judgment WDL  Confusion None  Danger to Self  Current suicidal ideation? Denies  Danger to Others  Danger to Others None reported or observed

## 2021-07-12 NOTE — Progress Notes (Signed)
Progress note  Pt found in bed; compliant with medication administration. Pt was pleasant and bright on approach. Pt denied any physical symptoms. Pt's affect doesn't seem to be congruent with their admission presentation. Pt denies any anxiety/depression. Pt denies si/hi/ah/vh and verbally agrees to approach staff if these become apparent or before harming themselves/others while at [facility].  A: Pt provided support and encouragement. Pt given medication per protocol and standing orders. Q30m safety checks implemented and continued.  R: Pt safe on the unit. Will continue to monitor.

## 2021-07-12 NOTE — Progress Notes (Signed)
The patient's positive event for the day is that she had playing cards brought in for her. In addition, she played cards with her peers.

## 2021-07-12 NOTE — Group Note (Signed)
Recreation Therapy Group Note   Group Topic:Animal Assisted Therapy   Group Date: 07/12/2021 Start Time: 1430 End Time: 1515 Facilitators: Caroll Rancher, LRT,CTRS Location: [address]   AAA/T Program Assumption of Risk Form signed by Patient/ or Parent Legal Guardian Yes  Patient is free of allergies or severe asthma Yes  Patient reports no fear of animals Yes  Patient reports no history of cruelty to animals Yes  Patient understands his/her participation is voluntary Yes  Patient washes hands before animal contact Yes  Patient washes hands after animal contact Yes   Affect/Mood: Appropriate   Participation Level: Engaged   Participation Quality: Independent   Behavior: Appropriate and Calm   Speech/Thought Process: Focused   Insight: Good   Judgement: Good   Modes of Intervention: Teaching laboratory technician   Patient Response to Interventions:  Engaged   Education Outcome:  Acknowledges education and In group clarification offered    Clinical Observations/Individualized Feedback:  Patient attended session and interacted appropriately with therapy dog and peers. Patient asked appropriate questions about therapy dog and his training. Patient shared stories about their pets at home with group.   Plan: Continue to engage patient in RT group sessions 2-3x/week.   Caroll Rancher, LRT,CTRS 07/12/2021 4:07 PM

## 2021-07-12 NOTE — Progress Notes (Signed)
[facility] MD Progress Note  07/12/2021 5:30 PM Stacey Travis  MRN:  [id number] In short : Patient is a 30 year old female with psychiatric history of MDD and GAD presenting from behavioral health hospital from behavioral health urgent care due to worsening depression and suicidal ideation.  24 EMR reviewed. Patient discussed in progression rounds with treatment team. MAR was reviewed and Pt has been complaint with scheduled medications and required PRN trazodone yesterday. Nursing notes indicate that Pt slept for  6.75 hours.  Evaluation on the unit today - Pt is seen and examined today. Pt reports that she is feeling better today and reports improvement in her depression and anxiety. Pt rates her depression at 4/10  and anxiety at 2-3/10 on a scale of 1-10 where 10 being severe symptoms.  She didn't sleep well last night and couldn't fall asleep and woke up few times. She took trazodone last night. Discussed increasing trazodone dose to 100 mg and adding melatonin PRN. She agrees with the plan. Pt states her appetite is still poor but has improved since admission.  She has been attending some groups.  She states she talked to her best friend yesterday and her Aunt came to visit her which went well.  Currently, Pt denies any active or passive suicidal ideation, homicidal ideation and, visual and auditory hallucination. He denies any paranoia, delusions and first rank symptoms. Pt denies any headache, nausea, vomiting, dizziness, chest pain, SOB, abdominal pain, diarrhea, and constipation. Pt denies any medication side effects and has been tolerating it well. Pt denies any concerns.     Principal Problem: MDD (major depressive disorder), recurrent severe, without psychosis (HCC) Diagnosis: Principal Problem:   MDD (major depressive disorder), recurrent severe, without psychosis (HCC)  Total Time spent with patient: 20 minutes  Past Psychiatric History: MDD and GAD  Past Medical History: History  reviewed. No pertinent past medical history. History reviewed. No pertinent surgical history. Family History: History reviewed. No pertinent family history.  Family Psychiatric  History: none reported Social History:  Social History   Substance and Sexual Activity  Alcohol Use Yes   Comment: socially     Social History   Substance and Sexual Activity  Drug Use No    Social History   Socioeconomic History   Marital status: Single    Spouse name: Not on file   Number of children: Not on file   Years of education: Not on file   Highest education level: Bachelor's degree (e.g., BA, AB, BS)  Occupational History   Not on file  Tobacco Use   Smoking status: Never   Smokeless tobacco: Not on file  Substance and Sexual Activity   Alcohol use: Yes    Comment: socially   Drug use: No   Sexual activity: Yes    Birth control/protection: Condom, Implant  Other Topics Concern   Not on file  Social History Narrative   Not on file   Social Determinants of Health   Financial Resource Strain: Not on file  Food Insecurity: Not on file  Transportation Needs: Not on file  Physical Activity: Not on file  Stress: Not on file  Social Connections: Not on file   Additional Social History:                         Sleep: Poor  Appetite:  Poor (Improving)  Current Medications: Current Facility-Administered Medications  Medication Dose Route Frequency Provider Last Rate Last Admin   acetaminophen (TYLENOL)  tablet 650 mg  650 mg Oral Q6H PRN White, Patrice L, NP       alum & mag hydroxide-simeth (MAALOX/MYLANTA) 200-200-20 MG/5ML suspension 30 mL  30 mL Oral Q4H PRN White, Patrice L, NP       hydrOXYzine (ATARAX/VISTARIL) tablet 25 mg  25 mg Oral TID PRN White, Patrice L, NP   25 mg at 07/10/21 2149   magnesium hydroxide (MILK OF MAGNESIA) suspension 30 mL  30 mL Oral Daily PRN White, Patrice L, NP       melatonin tablet 5 mg  5 mg Oral QHS PRN Armando Reichert, MD        mirtazapine (REMERON) tablet 30 mg  30 mg Oral QHS Massengill, Nathan, MD   30 mg at 07/11/21 2214   traZODone (DESYREL) tablet 100 mg  100 mg Oral QHS PRN,MR X 1 Taura Lamarre, MD       venlafaxine XR (EFFEXOR-XR) 24 hr capsule 75 mg  75 mg Oral Q breakfast France Ravens, MD   75 mg at 07/12/21 1129    Lab Results:  No results found for this or any previous visit (from the past 48 hour(s)).   Blood Alcohol level:  Lab Results  Component Value Date   [facility] <11 [phone]    Metabolic Disorder Labs: No results found for: HGBA1C, MPG No results found for: PROLACTIN No results found for: CHOL, TRIG, HDL, CHOLHDL, VLDL, LDLCALC  Physical Findings: AIMS: Facial and Oral Movements Muscles of Facial Expression: None, normal Lips and Perioral Area: None, normal Jaw: None, normal Tongue: None, normal,Extremity Movements Upper (arms, wrists, hands, fingers): None, normal Lower (legs, knees, ankles, toes): None, normal, Trunk Movements Neck, shoulders, hips: None, normal, Overall Severity Severity of abnormal movements (highest score from questions above): None, normal Incapacitation due to abnormal movements: None, normal Patient's awareness of abnormal movements (rate only patient's report): No Awareness, Dental Status Current problems with teeth and/or dentures?: No Does patient usually wear dentures?: No  CIWA:    COWS:     Musculoskeletal: Strength & Muscle Tone: within normal limits Gait & Station: normal Patient leans: N/A  Psychiatric Specialty Exam:  Presentation  General Appearance: Appropriate for Environment  Eye Contact:Good  Speech:Clear and Coherent  Speech Volume:Normal  Handedness:Right   Mood and Affect  Mood:Depressed; Anxious  Affect:Depressed; Congruent   Thought Process  Thought Processes:Coherent; Goal Directed  Descriptions of Associations:Intact  Orientation:Full (Time, Place and Person)  Thought Content:Logical  History of  Schizophrenia/Schizoaffective disorder:No  Duration of Psychotic Symptoms:No data recorded Hallucinations:Hallucinations: None  Ideas of Reference:None  Suicidal Thoughts:Suicidal Thoughts: No  Homicidal Thoughts:Homicidal Thoughts: No   Sensorium  Memory:Immediate Fair; Recent Fair; Remote Fair  Judgment:-- (Improving)  Insight:Fair   Executive Functions  Concentration:Good  Attention Span:Good  [place] of Knowledge:Good  Language:Good   Psychomotor Activity  Psychomotor Activity:Psychomotor Activity: Normal   Assets  Assets:Communication Skills; Desire for Improvement; Social Support; Physical Health; Resilience; Leisure Time; Transportation   Sleep  Sleep:Sleep: Fair Number of Hours of Sleep: 6.75    Physical Exam: Physical Exam Vitals and nursing note reviewed.  Constitutional:      General: She is not in acute distress.    Appearance: Normal appearance. She is not ill-appearing, toxic-appearing or diaphoretic.  HENT:     Head: Normocephalic and atraumatic.  Pulmonary:     Effort: Pulmonary effort is normal.  Musculoskeletal:        General: Normal range of motion.  Neurological:  General: No focal deficit present.     Mental Status: She is alert and oriented to person, place, and time.   Review of Systems  Constitutional:  Negative for fever.  HENT:  Negative for hearing loss.   Eyes:  Negative for blurred vision.  Respiratory:  Negative for cough and shortness of breath.   Cardiovascular:  Negative for chest pain.  Gastrointestinal:  Negative for abdominal pain, diarrhea, nausea and vomiting.  Neurological:  Negative for dizziness, focal weakness and headaches.  Psychiatric/Behavioral:  Positive for depression. Negative for hallucinations and suicidal ideas. The patient is nervous/anxious and has insomnia.   Blood pressure (!) 137/98, pulse 86, temperature 98.3 F (36.8 C), temperature source Oral, resp. rate 18, height 5\' 4"   (1.626 m), weight 58.5 kg, SpO2 100 %. Body mass index is 22.14 kg/m.   Treatment Plan Summary: Patient is a 30 year old female with history of depression anxiety presenting from [facility] to [facility] due to worsening depression and suicidal ideation.    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 -Add Melatonin 5 mg PRN.   PRNs Tylenol 650 mg for mild pain Maalox/Mylanta 30 mL for indigestion Hydroxyzine 25 mg tid for anxiety Milk of Magnesia 30 mL for constipation   Discharge Planning: Social work and case management to assist with discharge planning and identification of hospital follow-up needs prior to discharge Estimated LOS: 5-7 days Discharge Concerns: Need to establish a safety plan; Medication compliance and effectiveness Discharge Goals: Return home with outpatient referrals for mental health follow-up including medication management/psychotherapy   Armando Reichert, MD PGy2 07/12/2021, 5:30 PM

## 2021-07-12 NOTE — [facility] Suicide Risk Assessment (Signed)
[facility] INPATIENT:  Family/Significant Other Suicide Prevention Education  Suicide Prevention Education:  Education Completed; Stacey Travis [phone] (Mother) has been identified by the patient as the family member/significant other with whom the patient will be residing, and identified as the person(s) who will aid the patient in the event of a mental health crisis (suicidal ideations/suicide attempt).  With written consent from the patient, the family member/significant other has been provided the following suicide prevention education, prior to the and/or following the discharge of the patient.  The suicide prevention education provided includes the following: Suicide risk factors Suicide prevention and interventions National Suicide Hotline telephone number [facility] assessment telephone number [facility] Emergency Assistance 911 [facility] and/or Residential Mobile Crisis Unit telephone number  Request made of family/significant other to: Remove weapons (e.g., guns, rifles, knives), all items previously/currently identified as safety concern.   Remove drugs/medications (over-the-counter, prescriptions, illicit drugs), all items previously/currently identified as a safety concern.  The family member/significant other verbalizes understanding of the suicide prevention education information provided.  The family member/significant other agrees to remove the items of safety concern listed above.  CSW spoke with Stacey Travis who states that her daughter sounds better since being admitted to the hospital.  She states that medications would be beneficial for her daughter and that they are working on getting her daughter health insurance to be able to make affording those medications easier.  Stacey Travis also states that her daughter will be living with her Stacey Travis, who lives 5 minutes away from her.  Stacey Travis states that there is one firearm in the Aunt's home but it is  locked in a safe and that her daughter does not have access to it.  Stacey Travis states that she will continue to be a support and will be checking on her daughter regularly.  CSW completed SPE with Stacey Travis.   Stacey Travis 07/12/2021, 3:02 PM
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
## 2021-07-12 NOTE — Plan of Care (Signed)
  Problem: Education: Goal: Knowledge of [facility] Education information/materials will improve Outcome: Progressing Goal: Emotional status will improve Outcome: Progressing Goal: Mental status will improve Outcome: Progressing Goal: Verbalization of understanding the information provided will improve Outcome: Progressing   

## 2021-07-13 NOTE — Group Note (Signed)
Date:  07/13/2021 Time:  11:01 AM  Group Topic/Focus:  Goals Group:   The focus of this group is to help patients establish daily goals to achieve during treatment and discuss how the patient can incorporate goal setting into their daily lives to aide in recovery.    Participation Level:  Active  Participation Quality:  Appropriate  Affect:  Appropriate  Cognitive:  Appropriate  Insight: Appropriate  Engagement in Group:  Engaged  Modes of Intervention:  Discussion  Additional Comments:  Patient wants to attend group, eat health and to work on discharge plans.  [name] 07/13/2021, 11:01 AM

## 2021-07-13 NOTE — Group Note (Signed)
LCSW Group Therapy Note   Group Date: 07/13/2021 Start Time: 1300 End Time: 1400  Type of Therapy and Topic:  Group Therapy:  Self-Esteem   Participation Level:  Active  Description of Group: This group addressed positive self-esteem. Patients were given a worksheet with a blank shield. Patients were asked what a shield is and when it is used. Patients were asked to list, draw, or write protective factors in the their lives on their shields. Patients discussed the words, ideas and drawings that they put on their shield. Patients were encouraged to have a daily reflection of positive characteristics/ protective factors.  Therapeutic Goals Patient will verbalize two of their positive qualities Patient will demonstrate insight but naming social supports in their lives Patient will verbalize their feelings when voicing positive self affirmations and when voicing positive affirmations of others Patients will discuss the potential positive impact on their wellness/recovery of focusing on positive traits of self and others.  Summary of Patient Progress: Pt attend group and remained there the entire time.  Pt participated appropriately with their peers and demonstrated knowledge of the concepts discussed during the group.  Pt accepted the worksheet and added positive ideas and affirmations to the worksheet.  Aram Beecham, LCSWA 07/13/2021  1:52 PM

## 2021-07-13 NOTE — Progress Notes (Signed)
[facility] MD Progress Note  07/13/2021 3:23 PM Tannis Burstein  MRN:  [id number] In short : Patient is a 30 year old female with psychiatric history of MDD and GAD presenting from behavioral health hospital from behavioral health urgent care due to worsening depression and suicidal ideation.  24 EMR reviewed. Patient discussed in progression rounds with treatment team. MAR was reviewed and Pt has been complaint with scheduled medications and required PRN trazodone yesterday. Nursing notes indicate that Pt slept for  6.75 hours.  Evaluation on the unit today - Pt is seen and examined today. Pt reports that her mood is much better reports improvement in her depression and anxiety. Pt rates her depression at 2/10  and anxiety at 2/10 on a scale of 1-10 where 10 being severe symptoms.  She slept well last night with 100 mg of trazodone. Pt states her appetite has improved and she ate her breakfast this morning.  She has been attending groups and learned coping skills.  She states yesterday she attended stress management group, Alcohol Anonymous group and for Pet therapy. Currently, Pt denies any active or passive suicidal ideation, homicidal ideation and, visual and auditory hallucination. He denies any paranoia, delusions and first rank symptoms. Pt denies any headache, nausea, vomiting, dizziness, chest pain, SOB, abdominal pain, diarrhea, and constipation. Pt denies any medication side effects and has been tolerating it well. Pt denies any concerns.     Principal Problem: MDD (major depressive disorder), recurrent severe, without psychosis (HCC) Diagnosis: Principal Problem:   MDD (major depressive disorder), recurrent severe, without psychosis (HCC)  Total Time spent with patient: 20 minutes  Past Psychiatric History: MDD and GAD  Past Medical History: History reviewed. No pertinent past medical history. History reviewed. No pertinent surgical history. Family History: History reviewed. No pertinent  family history.  Family Psychiatric  History: none reported Social History:  Social History   Substance and Sexual Activity  Alcohol Use Yes   Comment: socially     Social History   Substance and Sexual Activity  Drug Use No    Social History   Socioeconomic History   Marital status: Single    Spouse name: Not on file   Number of children: Not on file   Years of education: Not on file   Highest education level: Bachelor's degree (e.g., BA, AB, BS)  Occupational History   Not on file  Tobacco Use   Smoking status: Never   Smokeless tobacco: Not on file  Substance and Sexual Activity   Alcohol use: Yes    Comment: socially   Drug use: No   Sexual activity: Yes    Birth control/protection: Condom, Implant  Other Topics Concern   Not on file  Social History Narrative   Not on file   Social Determinants of Health   Financial Resource Strain: Not on file  Food Insecurity: Not on file  Transportation Needs: Not on file  Physical Activity: Not on file  Stress: Not on file  Social Connections: Not on file   Additional Social History:                         Sleep: Good  Appetite:  Fair (Improving)  Current Medications: Current Facility-Administered Medications  Medication Dose Route Frequency Provider Last Rate Last Admin   acetaminophen (TYLENOL) tablet 650 mg  650 mg Oral Q6H PRN White, Patrice L, NP       alum & mag hydroxide-simeth (MAALOX/MYLANTA) 200-200-20 MG/5ML suspension 30  mL  30 mL Oral Q4H PRN White, Patrice L, NP       hydrOXYzine (ATARAX/VISTARIL) tablet 25 mg  25 mg Oral TID PRN White, Patrice L, NP   25 mg at 07/13/21 [id number]   magnesium hydroxide (MILK OF MAGNESIA) suspension 30 mL  30 mL Oral Daily PRN White, Patrice L, NP       melatonin tablet 5 mg  5 mg Oral QHS PRN Rosita Kea, Shavar Gorka, MD       mirtazapine (REMERON) tablet 30 mg  30 mg Oral QHS Massengill, Nathan, MD   30 mg at 07/12/21 2148   traZODone (DESYREL) tablet 100 mg  100 mg  Oral QHS PRN,MR X 1 Tandrea Kommer, MD   100 mg at 07/12/21 2108   venlafaxine XR (EFFEXOR-XR) 24 hr capsule 75 mg  75 mg Oral Q breakfast France Ravens, MD   75 mg at 07/13/21 [id number]    Lab Results:  No results found for this or any previous visit (from the past 48 hour(s)).   Blood Alcohol level:  Lab Results  Component Value Date   [facility] <11 [id number]    Metabolic Disorder Labs: No results found for: HGBA1C, MPG No results found for: PROLACTIN No results found for: CHOL, TRIG, HDL, CHOLHDL, VLDL, LDLCALC  Physical Findings: AIMS: Facial and Oral Movements Muscles of Facial Expression: None, normal Lips and Perioral Area: None, normal Jaw: None, normal Tongue: None, normal,Extremity Movements Upper (arms, wrists, hands, fingers): None, normal Lower (legs, knees, ankles, toes): None, normal, Trunk Movements Neck, shoulders, hips: None, normal, Overall Severity Severity of abnormal movements (highest score from questions above): None, normal Incapacitation due to abnormal movements: None, normal Patient's awareness of abnormal movements (rate only patient's report): No Awareness, Dental Status Current problems with teeth and/or dentures?: No Does patient usually wear dentures?: No  CIWA:    COWS:     Musculoskeletal: Strength & Muscle Tone: within normal limits Gait & Station: normal Patient leans: N/A  Psychiatric Specialty Exam:  Presentation  General Appearance: Fairly Groomed; Casual  Eye Contact:Good  Speech:Clear and Coherent  Speech Volume:Normal  Handedness:Right   Mood and Affect  Mood:Dysphoric; Anxious  Affect:Constricted   Thought Process  Thought Processes:Coherent; Goal Directed  Descriptions of Associations:Intact  Orientation:Full (Time, Place and Person)  Thought Content:Logical  History of Schizophrenia/Schizoaffective disorder:No  Duration of Psychotic Symptoms:No data recorded Hallucinations:Hallucinations: None  Ideas of  Reference:None  Suicidal Thoughts:Suicidal Thoughts: No  Homicidal Thoughts:Homicidal Thoughts: No   Sensorium  Memory:Immediate Fair; Recent Fair; Remote Fair  Judgment:Fair  Insight:Fair   Executive Functions  Concentration:Good  Attention Span:Good  [place] of Knowledge:Good  Language:Good   Psychomotor Activity  Psychomotor Activity:Psychomotor Activity: Normal   Assets  Assets:Communication Skills; Desire for Improvement; Social Support; Physical Health; Resilience   Sleep  Sleep:Sleep: Good    Physical Exam: Physical Exam Vitals and nursing note reviewed.  Constitutional:      General: She is not in acute distress.    Appearance: Normal appearance. She is not ill-appearing, toxic-appearing or diaphoretic.  HENT:     Head: Normocephalic and atraumatic.  Pulmonary:     Effort: Pulmonary effort is normal.  Musculoskeletal:        General: Normal range of motion.  Neurological:     General: No focal deficit present.     Mental Status: She is alert and oriented to person, place, and time.   Review of Systems  Constitutional:  Negative for fever.  HENT:  Negative  for hearing loss.   Eyes:  Negative for blurred vision.  Respiratory:  Negative for cough and shortness of breath.   Cardiovascular:  Negative for chest pain.  Gastrointestinal:  Negative for abdominal pain, diarrhea, nausea and vomiting.  Neurological:  Negative for dizziness, focal weakness and headaches.  Psychiatric/Behavioral:  Positive for depression. Negative for hallucinations and suicidal ideas. The patient is nervous/anxious. The patient does not have insomnia.   Blood pressure (!) 114/94, pulse 87, temperature 98.3 F (36.8 C), temperature source Oral, resp. rate 18, height 5\' 4"  (1.626 m), weight 58.5 kg, SpO2 100 %. Body mass index is 22.14 kg/m.  Treatment Plan Summary: Patient is a 30 year old female with history of depression anxiety presenting [facility] to [facility]  due to worsening depression and suicidal ideation.    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 -Continue Melatonin 5 mg PRN.   PRNs Tylenol 650 mg for mild pain Maalox/Mylanta 30 mL for indigestion Hydroxyzine 25 mg tid for anxiety Milk of Magnesia 30 mL for constipation   Discharge Planning: Social work and case management to assist with discharge planning and identification of hospital follow-up needs prior to discharge Estimated LOS: 5-7 days Discharge Concerns: Need to establish a safety plan; Medication compliance and effectiveness Discharge Goals: Return home with outpatient referrals for mental health follow-up including medication management/psychotherapy   Armando Reichert, MD PGy2 07/13/2021, 3:23 PM

## 2021-07-13 NOTE — Progress Notes (Signed)
   07/13/21 2115  Psych Admission Type (Psych Patients Only)  Admission Status Voluntary  Psychosocial Assessment  Patient Complaints None  Eye Contact Fair  Facial Expression Animated;Anxious  Affect Appropriate to circumstance  Speech Logical/coherent  Interaction Assertive  Motor Activity Other (Comment) (WDL)  Appearance/Hygiene Unremarkable  Behavior Characteristics Cooperative;Appropriate to situation  Mood Pleasant;Anxious  Thought Process  Coherency Concrete thinking  Content Blaming self  Delusions None reported or observed  Perception WDL  Hallucination None reported or observed  Judgment WDL  Confusion None  Danger to Self  Current suicidal ideation? Denies  Danger to Others  Danger to Others None reported or observed

## 2021-07-13 NOTE — Group Note (Signed)
Date:  07/13/2021 Time:  5:02 PM  Group Topic/Focus:  Self Care:   The focus of this group is to help patients understand the importance of self-care in order to improve or restore emotional, physical, spiritual, interpersonal, and financial health.    Participation Level:  Active  Participation Quality:  Appropriate  Affect:  Appropriate  Cognitive:  Appropriate  Insight: Appropriate  Engagement in Group:  Engaged Patient wants to to increase more relaxation time as a part of her routine for self-care.  Stacey Travis 07/13/2021, 5:02 PM

## 2021-07-13 NOTE — Group Note (Signed)
Recreation Therapy Group Note   Group Topic:Stress Management  Group Date: 07/13/2021 Start Time: 0930 End Time: 5456 Facilitators: Caroll Rancher, LRT,CTRS Location: [address] Dayroom   Goal Area(s) Addresses:  Patient will actively participate in stress management techniques presented during session.  Patient will successfully identify benefit of practicing stress management post d/c.    Group Description: Guided Imagery. LRT provided education, instruction, and demonstration on practice of visualization via guided imagery. Patient was asked to participate in the technique introduced during session. LRT debriefed including topics of mindfulness, stress management and specific scenarios each patient could use these techniques. Patients were given suggestions of ways to access scripts post d/c and encouraged to explore Youtube and other apps available on smartphones, tablets, and computers.   Affect/Mood: Appropriate   Participation Level: Engaged   Participation Quality: Independent   Behavior: Appropriate   Speech/Thought Process: Focused   Insight: Good   Judgement: Good   Modes of Intervention: Script, Ambient Sounds   Patient Response to Interventions:  Engaged   Education Outcome:  Acknowledges education and In group clarification offered    Clinical Observations/Individualized Feedback: Pt attended and participated in group.    Plan: Continue to engage patient in RT group sessions 2-3x/week.   Caroll Rancher, LRT,CTRS 07/13/2021 1:31 PM

## 2021-07-13 NOTE — Progress Notes (Signed)
Psychoeducational Group Note  Date:  07/13/2021 Time:  2138  Group Topic/Focus:  Relapse Prevention Planning:   The focus of this group is to define relapse and discuss the need for planning to combat relapse.  Participation Level: Did Not Attend  Participation Quality:  Not Applicable  Affect:  Not Applicable  Cognitive:  Not Applicable  Insight:  Not Applicable  Engagement in Group: Not Applicable  Additional Comments:  The patient did not attend the evening N.A.meeting.   Hazle Coca S 07/13/2021, 9:38 PM

## 2021-07-14 NOTE — Progress Notes (Signed)
[facility] MD Progress Note  07/14/2021 4:29 PM Tora Prunty  MRN:  [id number] Subjective:   Aliza Moret is a 30 yr old female with PPHx of MDD and GAD presenting with worsening depression and suicidal ideation.   Case was discussed in the multidisciplinary team. MAR was reviewed and patient was compliant with medications.  Patient required PRN Trazodone and Atarax    Psychiatric Team made the following recommendations yesterday:  -Continue Remeron 15 mg QHS -Continue Effexor 75 mg daily  -Continue Trazodone 100 mg QHS -Continue Melatonin 5 mg PRN QHS   On interview today patient reports that she is doing much better than when she was admitted.  She reports that her sleep was good last night.  She reports that her appetite has been picking up back to normal.  She reports no SI, HI, or AVH.  She states that she feels like her medicines have been successful in stabilizing her mood and improving her symptoms.  She reports no issues with her medications.  Discussed with her that we would plan for discharge tomorrow and she was excited to hear this.  She reports no other concerns at present.   Principal Problem: MDD (major depressive disorder), recurrent severe, without psychosis (HCC) Diagnosis: Principal Problem:   MDD (major depressive disorder), recurrent severe, without psychosis (HCC)  Total Time spent with patient: 30 minutes  Past Psychiatric History: MDD and GAD  Past Medical History: History reviewed. No pertinent past medical history. History reviewed. No pertinent surgical history. Family History: History reviewed. No pertinent family history. Family Psychiatric  History: Reports None Social History:  Social History   Substance and Sexual Activity  Alcohol Use Yes   Comment: socially     Social History   Substance and Sexual Activity  Drug Use No    Social History   Socioeconomic History   Marital status: Single    Spouse name: Not on file   Number of  children: Not on file   Years of education: Not on file   Highest education level: Bachelor's degree (e.g., BA, AB, BS)  Occupational History   Not on file  Tobacco Use   Smoking status: Never   Smokeless tobacco: Not on file  Substance and Sexual Activity   Alcohol use: Yes    Comment: socially   Drug use: No   Sexual activity: Yes    Birth control/protection: Condom, Implant  Other Topics Concern   Not on file  Social History Narrative   Not on file   Social Determinants of Health   Financial Resource Strain: Not on file  Food Insecurity: Not on file  Transportation Needs: Not on file  Physical Activity: Not on file  Stress: Not on file  Social Connections: Not on file   Additional Social History:                         Sleep: Good  Appetite:  Good  Current Medications: Current Facility-Administered Medications  Medication Dose Route Frequency Provider Last Rate Last Admin   acetaminophen (TYLENOL) tablet 650 mg  650 mg Oral Q6H PRN White, Patrice L, NP       alum & mag hydroxide-simeth (MAALOX/MYLANTA) 200-200-20 MG/5ML suspension 30 mL  30 mL Oral Q4H PRN White, Patrice L, NP       hydrOXYzine (ATARAX/VISTARIL) tablet 25 mg  25 mg Oral TID PRN White, Patrice L, NP   25 mg at 07/13/21 0832   magnesium hydroxide (MILK  OF MAGNESIA) suspension 30 mL  30 mL Oral Daily PRN White, Patrice L, NP       melatonin tablet 5 mg  5 mg Oral QHS PRN Armando Reichert, MD       mirtazapine (REMERON) tablet 30 mg  30 mg Oral QHS Massengill, Nathan, MD   30 mg at 07/13/21 2115   traZODone (DESYREL) tablet 100 mg  100 mg Oral QHS PRN,MR X 1 Doda, Vandana, MD   100 mg at 07/13/21 2115   venlafaxine XR (EFFEXOR-XR) 24 hr capsule 75 mg  75 mg Oral Q breakfast France Ravens, MD   75 mg at 07/14/21 1037    Lab Results: No results found for this or any previous visit (from the past 48 hour(s)).  Blood Alcohol level:  Lab Results  Component Value Date   [facility] <11 [id number]     Metabolic Disorder Labs: No results found for: HGBA1C, MPG No results found for: PROLACTIN No results found for: CHOL, TRIG, HDL, CHOLHDL, VLDL, LDLCALC  Physical Findings: AIMS: Facial and Oral Movements Muscles of Facial Expression: None, normal Lips and Perioral Area: None, normal Jaw: None, normal Tongue: None, normal,Extremity Movements Upper (arms, wrists, hands, fingers): None, normal Lower (legs, knees, ankles, toes): None, normal, Trunk Movements Neck, shoulders, hips: None, normal, Overall Severity Severity of abnormal movements (highest score from questions above): None, normal Incapacitation due to abnormal movements: None, normal Patient's awareness of abnormal movements (rate only patient's report): No Awareness, Dental Status Current problems with teeth and/or dentures?: No Does patient usually wear dentures?: No  CIWA:    COWS:     Musculoskeletal: Strength & Muscle Tone: within normal limits Gait & Station: normal Patient leans: N/A  Psychiatric Specialty Exam:  Presentation  General Appearance: Appropriate for Environment; Casual; Fairly Groomed  Eye Contact:Good  Speech:Clear and Coherent; Normal Rate  Speech Volume:Normal  Handedness:Right   Mood and Affect  Mood:Euthymic  Affect:Congruent; Appropriate   Thought Process  Thought Processes:Coherent; Goal Directed  Descriptions of Associations:Intact  Orientation:Full (Time, Place and Person)  Thought Content:Logical  History of Schizophrenia/Schizoaffective disorder:No  Duration of Psychotic Symptoms:No data recorded Hallucinations:Hallucinations: None  Ideas of Reference:None  Suicidal Thoughts:Suicidal Thoughts: No  Homicidal Thoughts:Homicidal Thoughts: No   Sensorium  Memory:Immediate Fair; Recent Fair  Judgment:Fair  Insight:Fair   Executive Functions  Concentration:Good  Attention Span:Good  [place] of  Knowledge:Good  Language:Good   Psychomotor Activity  Psychomotor Activity:Psychomotor Activity: Normal   Assets  Assets:Communication Skills; Desire for Improvement; Physical Health; Resilience; Social Support   Sleep  Sleep:Sleep: Good    Physical Exam: Physical Exam Vitals and nursing note reviewed.  Constitutional:      General: She is not in acute distress.    Appearance: Normal appearance. She is normal weight. She is not ill-appearing or toxic-appearing.  HENT:     Head: Normocephalic and atraumatic.  Pulmonary:     Effort: Pulmonary effort is normal.  Musculoskeletal:        General: Normal range of motion.  Neurological:     General: No focal deficit present.     Mental Status: She is alert.   Review of Systems  Respiratory:  Negative for cough and shortness of breath.   Cardiovascular:  Negative for chest pain.  Gastrointestinal:  Negative for abdominal pain, constipation, diarrhea, nausea and vomiting.  Neurological:  Negative for weakness and headaches.  Psychiatric/Behavioral:  Negative for depression, hallucinations and suicidal ideas. The patient is not nervous/anxious.   Blood pressure  134/86, pulse 81, temperature 98.6 F (37 C), temperature source Oral, resp. rate 18, height 5\' 4"  (1.626 m), weight 58.5 kg, SpO2 100 %. Body mass index is 22.14 kg/m.   Treatment Plan Summary: Daily contact with patient to assess and evaluate symptoms and progress in treatment and Medication management  Merrideth Mizrachi is a 30 yr old female with PPHx of MDD and GAD presenting with worsening depression and suicidal ideation.   Patient is responding well to her medications.  We will not make any medication changes at this time.  We will plan for discharge tomorrow.  We will continue to monitor.   Major depressive disorder-severe, recurrent episode without psychosis Generalized anxiety disorder Cannabis use disorder-moderate -Continue Remeron 15 mg QHS -Continue  Effexor 75 mg daily     Insomnia: -Continue Trazodone 100 mg QHS -Continue Melatonin 5 mg PRN QHS   -Continue PRN's: Tylenol, Maalox, Atarax, Milk of Magnesia  Briant Cedar, MD 07/14/2021, 4:29 PM

## 2021-07-14 NOTE — [facility] Group Notes (Signed)
[facility] Group Notes:  (Nursing/MHT/Case Management/Adjunct)  Date:  07/14/2021  Time:  8:33 PM  Type of Therapy:  Psychoeducational Skills  Participation Level:  Did Not Attend  Participation Quality:   DNA  Affect:   DNA  Cognitive:   DNA  Insight:  None  Engagement in Group:   DNA  Modes of Intervention:   DNA  Summary of Progress/Problems:  Stacey Travis 07/14/2021, 8:33 PM

## 2021-07-14 NOTE — BHH Group Notes (Signed)
Orientation group and PsychoED group. Patients were given a poem by Buddist Philosopher Lao Tzu regarding the power of our thoughts. Patients were then given examples of how positive reframing can help shape our thoughts and impact our mood. Patients were then asked to explore one negative thought they would like to let go of. Patient shared and was appropriate 

## 2021-07-14 NOTE — Progress Notes (Signed)
   07/14/21 2025  Psych Admission Type (Psych Patients Only)  Admission Status Voluntary  Psychosocial Assessment  Patient Complaints None  Eye Contact Fair  Facial Expression Animated  Affect Appropriate to circumstance  Speech Logical/coherent  Interaction Assertive  Motor Activity Other (Comment) (wnl)  Appearance/Hygiene Unremarkable  Behavior Characteristics Cooperative  Mood Pleasant  Thought Process  Coherency WDL  Content WDL  Delusions None reported or observed  Perception WDL  Hallucination None reported or observed  Judgment WDL  Danger to Self  Current suicidal ideation? Denies  Danger to Others  Danger to Others None reported or observed   Pt seen in milieu interacting with peers. Pt denies SI, HI, AVH and pain. Denies anxiety and depression as well. Pt says she is discharging tomorrow and is excited. Pt endorses good appetite, sleep and has been attending groups.

## 2021-07-14 NOTE — Progress Notes (Incomplete)
°   07/14/21 1137  Psych Admission Type (Psych Patients Only)  Admission Status Voluntary  Psychosocial Assessment  Patient Complaints None  Eye Contact Fair  Facial Expression Animated;Anxious  Affect Appropriate to circumstance  Speech Logical/coherent  Interaction Assertive  Motor Activity Other (Comment) (WDL)  Appearance/Hygiene Unremarkable  Behavior Characteristics Cooperative  Mood Pleasant  Thought Process  Coherency Concrete thinking  Content WDL  Delusions None reported or observed  Perception WDL  Hallucination None reported or observed  Judgment WDL  Confusion None  Danger to Self  Current suicidal ideation? Denies  Danger to Others  Danger to Others None reported or observed   D

## 2021-07-15 ENCOUNTER — Encounter (HOSPITAL_COMMUNITY): Payer: Self-pay

## 2021-07-15 MED ORDER — MIRTAZAPINE 30 MG PO TABS
30.0000 mg | ORAL_TABLET | Freq: Every day | ORAL | 0 refills | Status: DC
  Filled 2021-07-20: qty 30, 30d supply, fill #0

## 2021-07-15 MED ORDER — MELATONIN 5 MG PO TABS: 5.0000 mg | ORAL_TABLET | Freq: Every evening | ORAL | 0 refills | Status: DC | PRN

## 2021-07-15 MED ORDER — TRAZODONE HCL 100 MG PO TABS
100.0000 mg | ORAL_TABLET | Freq: Every evening | ORAL | 0 refills | Status: DC | PRN
Start: 2021-07-15 — End: 2021-08-01
  Filled 2021-07-20: qty 30, 15d supply, fill #0

## 2021-07-15 MED ORDER — HYDROXYZINE HCL 25 MG PO TABS
25.0000 mg | ORAL_TABLET | Freq: Every day | ORAL | 0 refills | Status: DC | PRN
  Filled 2021-07-20: qty 30, 30d supply, fill #0

## 2021-07-15 MED ORDER — VENLAFAXINE HCL ER 75 MG PO CP24
75.0000 mg | ORAL_CAPSULE | Freq: Every day | ORAL | 0 refills | Status: DC
  Filled 2021-07-20: qty 30, 30d supply, fill #0
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
## 2021-07-15 NOTE — BH IP Treatment Plan (Unsigned)
Interdisciplinary Treatment and Diagnostic Plan Update  07/15/2021 Glendora Clouatre MRN: [id number]  Principal Diagnosis: MDD (major depressive disorder), recurrent severe, without psychosis (HCC)  Secondary Diagnoses: Principal Problem:   MDD (major depressive disorder), recurrent severe, without psychosis (HCC)   Current Medications:  Current Facility-Administered Medications  Medication Dose Route Frequency Provider Last Rate Last Admin   acetaminophen (TYLENOL) tablet 650 mg  650 mg Oral Q6H PRN White, Patrice L, NP       alum & mag hydroxide-simeth (MAALOX/MYLANTA) 200-200-20 MG/5ML suspension 30 mL  30 mL Oral Q4H PRN White, Patrice L, NP       hydrOXYzine (ATARAX/VISTARIL) tablet 25 mg  25 mg Oral TID PRN White, Patrice L, NP   25 mg at 07/13/21 5176   magnesium hydroxide (MILK OF MAGNESIA) suspension 30 mL  30 mL Oral Daily PRN White, Patrice L, NP       melatonin tablet 5 mg  5 mg Oral QHS PRN Karsten Ro, MD   5 mg at 07/14/21 2140   mirtazapine (REMERON) tablet 30 mg  30 mg Oral QHS Massengill, Harrold Donath, MD   30 mg at 07/14/21 2140   traZODone (DESYREL) tablet 100 mg  100 mg Oral QHS PRN,MR X 1 Doda, Vandana, MD   100 mg at 07/14/21 2140   venlafaxine XR (EFFEXOR-XR) 24 hr capsule 75 mg  75 mg Oral Q breakfast Park Pope, MD   75 mg at 07/15/21 1607   PTA Medications: Medications Prior to Admission  Medication Sig Dispense Refill Last Dose   levonorgestrel (MIRENA, 52 MG,) 20 MCG/DAY IUD 1 Intra Uterine Device by Intrauterine route once.       Patient Stressors: Financial difficulties   Other: housing    Patient Strengths: Ability for insight  Average or above average intelligence  Capable of independent living  Printmaker for treatment/growth  Physical Health  Supportive family/friends   Treatment Modalities: Medication Management, Group therapy, Case management,  1 to 1 session with clinician, Psychoeducation, Recreational  therapy.   Physician Treatment Plan for Primary Diagnosis: MDD (major depressive disorder), recurrent severe, without psychosis (HCC) Long Term Goal(s): Improvement in symptoms so as ready for discharge   Short Term Goals: Ability to identify changes in lifestyle to reduce recurrence of condition will improve Ability to verbalize feelings will improve Ability to disclose and discuss suicidal ideas Ability to demonstrate self-control will improve Ability to identify and develop effective coping behaviors will improve Ability to maintain clinical measurements within normal limits will improve Compliance with prescribed medications will improve Ability to identify triggers associated with substance abuse/mental health issues will improve  Medication Management: Evaluate patient's response, side effects, and tolerance of medication regimen.  Therapeutic Interventions: 1 to 1 sessions, Unit Group sessions and Medication administration.  Evaluation of Outcomes: Adequate for Discharge  Physician Treatment Plan for Secondary Diagnosis: Principal Problem:   MDD (major depressive disorder), recurrent severe, without psychosis (HCC)  Long Term Goal(s): Improvement in symptoms so as ready for discharge   Short Term Goals: Ability to identify changes in lifestyle to reduce recurrence of condition will improve Ability to verbalize feelings will improve Ability to disclose and discuss suicidal ideas Ability to demonstrate self-control will improve Ability to identify and develop effective coping behaviors will improve Ability to maintain clinical measurements within normal limits will improve Compliance with prescribed medications will improve Ability to identify triggers associated with substance abuse/mental health issues will improve     Medication Management: Evaluate patient's response, side  effects, and tolerance of medication regimen.  Therapeutic Interventions: 1 to 1 sessions, Unit  Group sessions and Medication administration.  Evaluation of Outcomes: Adequate for Discharge   RN Treatment Plan for Primary Diagnosis: MDD (major depressive disorder), recurrent severe, without psychosis ([place]) Long Term Goal(s): Knowledge of disease and therapeutic regimen to maintain health will improve  Short Term Goals: Ability to participate in decision making will improve, Ability to verbalize feelings will improve, and Ability to identify and develop effective coping behaviors will improve  Medication Management: RN will administer medications as ordered by provider, will assess and evaluate patient's response and provide education to patient for prescribed medication. RN will report any adverse and/or side effects to prescribing provider.  Therapeutic Interventions: 1 on 1 counseling sessions, Psychoeducation, Medication administration, Evaluate responses to treatment, Monitor vital signs and CBGs as ordered, Perform/monitor CIWA, COWS, AIMS and Fall Risk screenings as ordered, Perform wound care treatments as ordered.  Evaluation of Outcomes: Adequate for Discharge   LCSW Treatment Plan for Primary Diagnosis: MDD (major depressive disorder), recurrent severe, without psychosis ([place]) Long Term Goal(s): Safe transition to appropriate next level of care at discharge, Engage patient in therapeutic group addressing interpersonal concerns.  Short Term Goals: Engage patient in aftercare planning with referrals and resources, Increase emotional regulation, and Facilitate acceptance of mental health diagnosis and concerns  Therapeutic Interventions: Assess for all discharge needs, 1 to 1 time with Social worker, Explore available resources and support systems, Assess for adequacy in community support network, Educate family and significant other(s) on suicide prevention, Complete Psychosocial Assessment, Interpersonal group therapy.  Evaluation of Outcomes: Adequate for  Discharge   Progress in Treatment: Attending groups: Yes. Participating in groups: Yes. Taking medication as prescribed: Yes. Toleration medication: Yes. Family/Significant other contact made: Yes, individual(s) contacted:  mother Patient understands diagnosis: Yes. Discussing patient identified problems/goals with staff: Yes. Medical problems stabilized or resolved: Yes. Denies suicidal/homicidal ideation: Yes. Issues/concerns per patient self-inventory: No. Other: None  New problem(s) identified: No, Describe:  None  New Short Term/Long Term Goal(s):medication stabilization, elimination of SI thoughts, development of comprehensive mental wellness plan.   Patient Goals: "Be healthy enough to handle the outside world and get the resources I need."  Discharge Plan or Barriers: Pt was given information about services for medication management and therapy at [facility]   Reason for Continuation of Hospitalization: Medication stabilization  Estimated Length of Stay: 3-5 days   Scribe for Treatment Team: Eliott Nine 07/15/2021 10:11 AM

## 2021-07-15 NOTE — Discharge Summary (Addendum)
Physician Discharge Summary Note  Patient:  Stacey Travis is an 30 y.o., female MRN:  [id number] DOB:  1991-06-10 Patient phone:  [phone] (home)  Patient address:   [address] [facility] [address],  Total Time spent with patient: 15 minutes  Date of Admission:  07/10/2021 Date of Discharge: 07/15/2021  Reason for Admission:  SI  Principal Problem: MDD (major depressive disorder), recurrent severe, without psychosis ([place]) Discharge Diagnoses: Principal Problem:   MDD (major depressive disorder), recurrent severe, without psychosis ([place])   Past Psychiatric History:  Previous Psych Diagnoses: MDD and GAD Prior inpatient treatment: None Current/prior outpatient treatment: None Prior rehab hx: none Psychotherapy hx: none History of suicide attempt: yes History of homicidal attempt: none Psychiatric medication history: depression and anxiety Psychiatric medication compliance history: poor Current Psychiatrist: none Current therapist: none   Past Medical History: History reviewed. No pertinent past medical history. History reviewed. No pertinent surgical history. Family History: History reviewed. No pertinent family history. Family Psychiatric  History: None reported Social History:  Social History   Substance and Sexual Activity  Alcohol Use Yes   Comment: socially     Social History   Substance and Sexual Activity  Drug Use No    Social History   Socioeconomic History   Marital status: Single    Spouse name: Not on file   Number of children: Not on file   Years of education: Not on file   Highest education level: Bachelor's degree (e.g., BA, AB, BS)  Occupational History   Not on file  Tobacco Use   Smoking status: Never   Smokeless tobacco: Not on file  Substance and Sexual Activity   Alcohol use: Yes    Comment: socially   Drug use: No   Sexual activity: Yes    Birth control/protection: Condom, Implant  Other Topics Concern   Not  on file  Social History Narrative   Not on file   Social Determinants of Health   Financial Resource Strain: Not on file  Food Insecurity: Not on file  Transportation Needs: Not on file  Physical Activity: Not on file  Stress: Not on file  Social Connections: Not on file    Hospital Course:  Stacey Travis is a 30 yo patient who was admitted from [facility] due to worsening depression and SI. Patient endorsed that she had stopped taking Wellbutrin 150mg  BL and Buspar 5mg  BID. Patient endorsed she had had multiple stressors lately and also felt that she had no support system. Patient did not screen positive for Bipolar disorder or PTSD. Patient was started on Effexor and Remeron for treatment of MDD, severe recurrent episodes w/o psychiatric features. Patient was not restarted on Wellbutrin and Buspar as she felt that had not been beneficial when patient had been compliant with her meds. Patient noted to respond well but continued to endorse poor appetite and insomnia; therefore her Remeron was titrated up to 30mg . Patient was also started on Trazodone titrated up to 100mg  and melatonin 5mg . Patient responded very well to this medication.  Patient also received brief counseling about her THC use. Patient's family visited patient and patient endorsed recognizing that she had more of a support system than thought. Patient was noted to attend group therapy and interact well with other patient's on the unit. Patient had no behavior cocnerns. Patient's family assisted patient in her disp and provided housing for her and encouraged patient to communicate her feelings to them. At discharge patient endorsed feeling that she had  benefited from the therapeutic interventions. Patient denied SI, HI, and AVH at discharge.   Physical Findings: AIMS: Facial and Oral Movements Muscles of Facial Expression: None, normal Lips and Perioral Area: None, normal Jaw: None, normal Tongue: None, normal,Extremity  Movements Upper (arms, wrists, hands, fingers): None, normal Lower (legs, knees, ankles, toes): None, normal, Trunk Movements Neck, shoulders, hips: None, normal, Overall Severity Severity of abnormal movements (highest score from questions above): None, normal Incapacitation due to abnormal movements: None, normal Patient's awareness of abnormal movements (rate only patient's report): No Awareness, Dental Status Current problems with teeth and/or dentures?: No Does patient usually wear dentures?: No  CIWA:    COWS:     Musculoskeletal: Strength & Muscle Tone: within normal limits Gait & Station: normal Patient leans: N/A   Psychiatric Specialty Exam:  Presentation  General Appearance: Appropriate for Environment  Eye Contact:Good  Speech:Clear and Coherent  Speech Volume:Normal  Handedness:Right   Mood and Affect  Mood:Euthymic  Affect:Appropriate; Congruent   Thought Process  Thought Processes:Coherent  Descriptions of Associations:Intact  Orientation:Full (Time, Place and Person)  Thought Content:Logical  History of Schizophrenia/Schizoaffective disorder:No  Duration of Psychotic Symptoms:No data recorded Hallucinations:Hallucinations: None  Ideas of Reference:None  Suicidal Thoughts:Suicidal Thoughts: No  Homicidal Thoughts:Homicidal Thoughts: No   Sensorium  Memory:Immediate Good; Recent Good; Remote Good  Judgment:Good  Insight:Good   Executive Functions  Concentration:Good  Attention Span:Good  [place] of Knowledge:Good  Language:Good   Psychomotor Activity  Psychomotor Activity:Psychomotor Activity: Normal   Assets  Assets:Communication Skills; Social Support; Resilience; Housing   Sleep  Sleep:Sleep: Good    Physical Exam: Physical Exam Constitutional:      Appearance: Normal appearance.  HENT:     Head: Normocephalic and atraumatic.  Pulmonary:     Effort: Pulmonary effort is normal.  Neurological:      Mental Status: She is alert and oriented to person, place, and time.   Review of Systems  Psychiatric/Behavioral:  Negative for hallucinations and suicidal ideas.   Blood pressure (!) 128/99, pulse (!) 106, temperature 98.2 F (36.8 C), temperature source Oral, resp. rate 18, height 5\' 4"  (1.626 m), weight 58.5 kg, SpO2 100 %. Body mass index is 22.14 kg/m.   Social History   Tobacco Use  Smoking Status Never  Smokeless Tobacco Not on file   Tobacco Cessation:  N/A, patient does not currently use tobacco products   Blood Alcohol level:  Lab Results  Component Value Date   ETH <11 [id number]    Metabolic Disorder Labs:  No results found for: HGBA1C, MPG No results found for: PROLACTIN No results found for: CHOL, TRIG, HDL, CHOLHDL, VLDL, LDLCALC  See Psychiatric Specialty Exam and Suicide Risk Assessment completed by Attending Physician prior to discharge.  Discharge destination:  Home  Is patient on multiple antipsychotic therapies at discharge:  No   Has Patient had three or more failed trials of antipsychotic monotherapy by history:  No  Recommended Plan for Multiple Antipsychotic Therapies: NA   Allergies as of 07/15/2021       Reactions   Sulfa Antibiotics Rash        Medication List     TAKE these medications      Indication  hydrOXYzine 25 MG tablet Commonly known as: ATARAX Take 1 tablet (25 mg total) by mouth daily as needed for anxiety.  Indication: Feeling Anxious   melatonin 5 MG Tabs Take 1 tablet (5 mg total) by mouth at bedtime as needed.  Indication: Trouble  Sleeping   Mirena (52 MG) 20 MCG/DAY Iud Generic drug: levonorgestrel 1 Intra Uterine Device by Intrauterine route once.  Indication: Birth Control Treatment   mirtazapine 30 MG tablet Commonly known as: REMERON Take 1 tablet (30 mg total) by mouth at bedtime.  Indication: Major Depressive Disorder   traZODone 100 MG tablet Commonly known as: DESYREL Take 1 tablet (100  mg total) by mouth at bedtime as needed and may repeat dose one time if needed for sleep.  Indication: Trouble Sleeping   venlafaxine XR 75 MG 24 hr capsule Commonly known as: EFFEXOR-XR Take 1 capsule (75 mg total) by mouth daily with breakfast. Start taking on: July 16, 2021  Indication: Major Depressive Disorder        Follow-up Information     [facility] Follow up.   Specialty: Behavioral Health Why: You may go to this provider for outpatient therapy and medication management services during walk in hours:  Monday through Wednesday, from 7:00 am to 11:00 am.  Services are provided on a first come, first served basis.  Please arrive early. Contact information: [address] [place] [address] [phone]                Follow-up recommendations:  Follow up recommendations: - Activity as tolerated. - Diet as recommended by PCP. - Keep all scheduled follow-up appointments as recommended.   Comments:  Patient is instructed to take all prescribed medications as recommended. Report any side effects or adverse reactions to your outpatient psychiatrist. Patient is instructed to abstain from alcohol and illegal drugs while on prescription medications. In the event of worsening symptoms, patient is instructed to call the crisis hotline, 911, or go to the nearest emergency department for evaluation and treatment.    Signed:  PGY-2 Bobbye Morton, MD 07/15/2021, 12:23 PM

## 2021-07-15 NOTE — Group Note (Signed)
Date:  07/15/2021 Time:  10:56 AM  Group Topic/Focus:  Orientation:   The focus of this group is to educate the patient on the purpose and policies of crisis stabilization and provide a format to answer questions about their admission.  The group details unit policies and expectations of patients while admitted.    Participation Level:  Active  Participation Quality:  Appropriate  Affect:  Appropriate  Cognitive:  Appropriate  Insight: Appropriate  Engagement in Group:  Engaged  Modes of Intervention:  Discussion  Additional Comments:  Patient being discharged today.   [name] 07/15/2021, 10:56 AM

## 2021-07-15 NOTE — Progress Notes (Signed)
  [facility] Adult Case Management Discharge Plan :  Will you be returning to the same living situation after discharge:  No. Aunt's Home  At discharge, do you have transportation home?: Yes,  Family Do you have the ability to pay for your medications: Yes,  Family   Release of information consent forms completed and in the chart;  Patient's signature needed at discharge.  Patient to Follow up at:  Follow-up Information     [facility] Follow up.   Specialty: Behavioral Health Why: You may go to this provider for outpatient therapy and medication management services during walk in hours:  Monday through Wednesday, from 7:00 am to 11:00 am.  Services are provided on a first come, first served basis.  Please arrive early. Contact information: [address] [place] [address] [phone]                Next level of care provider has access to [facility] Link:yes  Safety Planning and Suicide Prevention discussed: Yes,  with patient and mother      Has patient been referred to the Quitline?: N/A patient is not a smoker  Patient has been referred for addiction treatment: N/A  Aram Beecham, LCSWA 07/15/2021, 9:40 AM

## 2021-07-15 NOTE — Progress Notes (Signed)
Pt discharged to lobby. Pt was stable and appreciative at that time. All papers and prescriptions were given and valuables returned. Verbal understanding expressed. Denies SI/HI and A/VH. Pt given opportunity to express concerns and ask questions.  

## 2021-07-20 ENCOUNTER — Other Ambulatory Visit: Payer: Self-pay

## 2021-07-21 ENCOUNTER — Other Ambulatory Visit: Payer: Self-pay

## 2021-07-22 ENCOUNTER — Other Ambulatory Visit: Payer: Self-pay

## 2021-08-01 ENCOUNTER — Encounter (HOSPITAL_COMMUNITY): Payer: Self-pay | Admitting: Student in an Organized Health Care Education/Training Program

## 2021-08-01 ENCOUNTER — Other Ambulatory Visit: Payer: Self-pay

## 2021-08-01 ENCOUNTER — Ambulatory Visit (INDEPENDENT_AMBULATORY_CARE_PROVIDER_SITE_OTHER): Payer: No Payment, Other | Admitting: Student in an Organized Health Care Education/Training Program

## 2021-08-01 VITALS — BP 122/73 | HR 78 | Ht 64.0 in | Wt 135.0 lb

## 2021-08-01 DIAGNOSIS — F332 Major depressive disorder, recurrent severe without psychotic features: Secondary | ICD-10-CM

## 2021-08-01 DIAGNOSIS — F411 Generalized anxiety disorder: Secondary | ICD-10-CM | POA: Diagnosis not present

## 2021-08-01 MED ORDER — MIRTAZAPINE 30 MG PO TABS
30.0000 mg | ORAL_TABLET | Freq: Every day | ORAL | 3 refills | Status: DC
  Filled 2021-08-01 – 2021-08-22 (×2): qty 30, 30d supply, fill #0
  Filled 2021-10-03: qty 30, 30d supply, fill #1
  Filled 2021-11-09 – 2021-11-22 (×2): qty 30, 30d supply, fill #2
  Filled 2022-01-17: qty 30, 30d supply, fill #3

## 2021-08-01 MED ORDER — VENLAFAXINE HCL ER 75 MG PO CP24
75.0000 mg | ORAL_CAPSULE | Freq: Every day | ORAL | 3 refills | Status: DC
  Filled 2021-08-01 – 2021-08-22 (×2): qty 30, 30d supply, fill #0
  Filled 2021-10-03: qty 30, 30d supply, fill #1
  Filled 2021-11-09 – 2021-11-22 (×2): qty 30, 30d supply, fill #2
  Filled 2022-01-17: qty 30, 30d supply, fill #3

## 2021-08-01 MED ORDER — TRAZODONE HCL 100 MG PO TABS
100.0000 mg | ORAL_TABLET | Freq: Every evening | ORAL | 3 refills | Status: DC | PRN
  Filled 2021-08-01 – 2021-08-22 (×2): qty 30, 15d supply, fill #0
  Filled 2021-10-03: qty 30, 15d supply, fill #1
  Filled 2021-11-09 – 2022-01-17 (×2): qty 30, 15d supply, fill #2

## 2021-08-01 MED ORDER — MELATONIN 5 MG PO TABS
5.0000 mg | ORAL_TABLET | Freq: Every evening | ORAL | 3 refills | Status: DC | PRN
  Filled 2021-08-01: qty 30, 30d supply, fill #0
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
## 2021-08-10 ENCOUNTER — Other Ambulatory Visit: Payer: Self-pay

## 2021-08-22 ENCOUNTER — Other Ambulatory Visit: Payer: Self-pay

## 2021-09-26 ENCOUNTER — Encounter (HOSPITAL_COMMUNITY): Payer: No Payment, Other | Admitting: Student in an Organized Health Care Education/Training Program

## 2021-10-03 ENCOUNTER — Other Ambulatory Visit: Payer: Self-pay

## 2021-11-09 ENCOUNTER — Other Ambulatory Visit: Payer: Self-pay

## 2021-11-10 ENCOUNTER — Other Ambulatory Visit: Payer: Self-pay

## 2021-11-16 ENCOUNTER — Other Ambulatory Visit: Payer: Self-pay

## 2021-11-22 ENCOUNTER — Other Ambulatory Visit: Payer: Self-pay

## 2022-01-17 ENCOUNTER — Other Ambulatory Visit: Payer: Self-pay

## 2022-01-24 ENCOUNTER — Other Ambulatory Visit: Payer: Self-pay

## 2022-03-21 ENCOUNTER — Ambulatory Visit (INDEPENDENT_AMBULATORY_CARE_PROVIDER_SITE_OTHER): Payer: No Payment, Other | Admitting: Psychiatry

## 2022-03-21 ENCOUNTER — Encounter (HOSPITAL_COMMUNITY): Payer: Self-pay | Admitting: Psychiatry

## 2022-03-21 ENCOUNTER — Other Ambulatory Visit: Payer: Self-pay

## 2022-03-21 VITALS — BP 123/59 | HR 73 | Ht 64.0 in | Wt 135.0 lb

## 2022-03-21 DIAGNOSIS — F332 Major depressive disorder, recurrent severe without psychotic features: Secondary | ICD-10-CM | POA: Diagnosis not present

## 2022-03-21 DIAGNOSIS — F411 Generalized anxiety disorder: Secondary | ICD-10-CM | POA: Diagnosis not present

## 2022-03-21 MED ORDER — VENLAFAXINE HCL ER 75 MG PO CP24
75.0000 mg | ORAL_CAPSULE | Freq: Every day | ORAL | 3 refills | Status: AC
  Filled 2022-03-21: qty 30, 30d supply, fill #0

## 2022-03-21 MED ORDER — HYDROXYZINE HCL 25 MG PO TABS
25.0000 mg | ORAL_TABLET | Freq: Every day | ORAL | 0 refills | Status: AC | PRN
  Filled 2022-03-21: qty 30, 30d supply, fill #0

## 2022-03-21 MED ORDER — TRAZODONE HCL 100 MG PO TABS
100.0000 mg | ORAL_TABLET | Freq: Every evening | ORAL | 3 refills | Status: AC | PRN
  Filled 2022-03-21: qty 30, 15d supply, fill #0

## 2022-03-21 MED ORDER — MIRTAZAPINE 30 MG PO TABS
30.0000 mg | ORAL_TABLET | Freq: Every day | ORAL | 3 refills | Status: AC
Start: 2022-03-21 — End: ?
  Filled 2022-03-21: qty 30, 30d supply, fill #0

## 2022-03-21 NOTE — Progress Notes (Signed)
BH MD/PA/NP OP Progress Note  03/21/2022 9:07 AM Stacey Travis  MRN:  [id number]  Chief Complaint: "The meds make a difference"  HPI: 31 year old female seen today for follow-up psychiatric evaluation.  She has a psychiatric history of depression, anxiety, and SI.  She is currently managed on hydroxyzine 25 mg daily as needed for anxiety, mirtazapine 30 mg nightly, trazodone 100 mg nightly as needed, and Effexor 75 mg daily.  She reports her medications are effective in managing her psychiatric conditions.  Today she is well-groomed, pleasant, cooperative, engaged in conversation, maintained eye contact.  She informed Clinical research associate that her medications make a difference.  She notes that her anxiety and depression at times are problematic but notes that she is able to cope with it.  She reports that she worries about finances and issues of daily life.  Provider conducted a GAD-7 and patient scored a 13.  Provider also conducted PHQ-9 and the patient scored a 15.  She endorses adequate sleep with assistance of trazodone.  She also reports her appetite is adequate.  Today she endorses passive SI however denies wanting to harm herself today.  She denies SI/HI/VH, mania, paranoia.  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  No medication changes made today.  Patient agreeable to continue medication as prescribed. Visit Diagnosis:    ICD-10-CM   1. GAD (generalized anxiety disorder)  F41.1 hydrOXYzine (ATARAX) 25 MG tablet    2. Severe episode of recurrent major depressive disorder, without psychotic features (HCC)  F33.2 mirtazapine (REMERON) 30 MG tablet    traZODone (DESYREL) 100 MG tablet    venlafaxine XR (EFFEXOR-XR) 75 MG 24 hr capsule      Past Psychiatric History: Depression, anxiety, and SI  Past Medical History: History reviewed. No pertinent past medical history. History reviewed. No pertinent surgical history.  Family Psychiatric  History:  Maternal grandmother and Mat Aunt: Bipolar, Sister: ADHD, Mom: ADHD and GAD  Family History: History reviewed. No pertinent family history.  Social History:  Social History   Socioeconomic History   Marital status: Single    Spouse name: Not on file   Number of children: Not on file   Years of education: Not on file   Highest education level: Bachelor's degree (e.g., BA, AB, BS)  Occupational History   Not on file  Tobacco Use   Smoking status: Never   Smokeless tobacco: Not on file  Substance and Sexual Activity   Alcohol use: Yes    Comment: socially   Drug use: No   Sexual activity: Yes    Birth control/protection: Condom, Implant  Other Topics Concern   Not on file  Social History Narrative   Not on file   Social Determinants of Health   Financial Resource Strain: Not on file  Food Insecurity: Not on file  Transportation Needs: Not on file  Physical Activity: Not on file  Stress: Not on file  Social Connections: Not on file    Allergies:  Allergies  Allergen Reactions   Sulfa Antibiotics Rash    Metabolic Disorder Labs: No results found for: "HGBA1C", "MPG" No results found for: "PROLACTIN" No results found for: "CHOL", "TRIG", "HDL", "CHOLHDL", "VLDL", "LDLCALC" Lab Results  Component Value Date   TSH 1.764 07/09/2021    Therapeutic Level Labs: No results found for: "LITHIUM" No results found for: "VALPROATE" No results found for: "CBMZ"  Current Medications: Current Outpatient Medications  Medication Sig Dispense Refill  hydrOXYzine (ATARAX) 25 MG tablet Take 1 tablet (25 mg total) by mouth daily as needed for anxiety. 30 tablet 0   levonorgestrel (MIRENA, 52 MG,) 20 MCG/DAY IUD 1 Intra Uterine Device by Intrauterine route once.     mirtazapine (REMERON) 30 MG tablet Take 1 tablet (30 mg total) by mouth at bedtime. 30 tablet 3   traZODone (DESYREL) 100 MG tablet Take 1 tablet (100 mg total) by mouth at bedtime as needed and may repeat  dose one time if needed for sleep. 30 tablet 3   venlafaxine XR (EFFEXOR-XR) 75 MG 24 hr capsule Take 1 capsule (75 mg total) by mouth daily with breakfast. 30 capsule 3   No current facility-administered medications for this visit.     Musculoskeletal: Strength & Muscle Tone: within normal limits Gait & Station: normal Patient leans: N/A  Psychiatric Specialty Exam: Review of Systems  Blood pressure (!) 123/59, pulse 73, height 5\' 4"  (1.626 m), weight 135 lb (61.2 kg), SpO2 100 %.Body mass index is 23.17 kg/m.  General Appearance: Well Groomed  Eye Contact:  Good  Speech:  Clear and Coherent and Normal Rate  Volume:  Normal  Mood:  Euthymic  Affect:  Appropriate and Congruent  Thought Process:  Coherent, Goal Directed, and Linear  Orientation:  Full (Time, Place, and Person)  Thought Content: WDL and Logical   Suicidal Thoughts:  Yes.  without intent/plan  Homicidal Thoughts:  No  Memory:  Immediate;   Good Recent;   Good Remote;   Good  Judgement:  Good  Insight:  Good  Psychomotor Activity:  Normal  Concentration:  Concentration: Good and Attention Span: Good  Recall:  Good  Fund of Knowledge: Good  Language: Good  Akathisia:  No  Handed:  Right  AIMS (if indicated): not done  Assets:  Communication Skills Desire for Improvement Financial Resources/Insurance Housing Intimacy Social Support  ADL's:  Intact  Cognition: WNL  Sleep:  Good   Screenings: AIMS    Flowsheet Row [facility]  AIMS Total Score 0      AUDIT    Flowsheet Row [facility]  Alcohol Use Disorder Identification Test Final Score (AUDIT) 1      GAD-7    Flowsheet Row Clinical Support from 03/21/2022 in [facility]  Total GAD-7 Score 13      PHQ2-9    Flowsheet Row Clinical Support from 03/21/2022 in  [facility] ED from 07/09/2021 in [facility] Counselor from 06/20/2021 in [facility]  PHQ-2 Total Score 2 4 2   PHQ-9 Total Score 15 19 16       Flowsheet Row Clinical Support from 03/21/2022 in [facility]Discharged[facility]21 [facility]  C-SSRS RISK CATEGORY Low Risk High Risk No Risk        Assessment and Plan: Patient reports that she is doing well on her current medication regimen.  No medication changes made today.  Patient agreeable to continue medication as prescribed.  1. Severe episode of recurrent major depressive disorder, without psychotic features (HCC)  Continue- mirtazapine (REMERON) 30 MG tablet; Take 1 tablet (30 mg total) by mouth at bedtime.  Dispense: 30 tablet; Refill: 3 Continue- traZODone (DESYREL) 100 MG tablet; Take 1 tablet (  100 mg total) by mouth at bedtime as needed and may repeat dose one time if needed for sleep.  Dispense: 30 tablet; Refill: 3 Continue- venlafaxine XR (EFFEXOR-XR) 75 MG 24 hr capsule; Take 1 capsule (75 mg total) by mouth daily with breakfast.  Dispense: 30 capsule; Refill: 3  2. GAD (generalized anxiety disorder)  Continue- hydrOXYzine (ATARAX) 25 MG tablet; Take 1 tablet (25 mg total) by mouth daily as needed for anxiety.  Dispense: 30 tablet; Refill: 0    Collaboration of Care: Collaboration of Care: Other provider involved in patient's care AEB primary psychiatric provider  Patient/Guardian was advised Release of Information must be obtained prior to any record release in order to collaborate their care with an outside provider. Patient/Guardian was advised if they have not already done so to contact the registration department to sign all necessary forms in order for [place] to release information regarding  their care.   Consent: Patient/Guardian gives verbal consent for treatment and assignment of benefits for services provided during this visit. Patient/Guardian expressed understanding and agreed to proceed.   Follow-up in 3 months   Shanna Cisco, NP 03/21/2022, 9:07 AM

## 2022-06-12 ENCOUNTER — Encounter (HOSPITAL_COMMUNITY): Payer: No Payment, Other | Admitting: Student in an Organized Health Care Education/Training Program

## 2023-01-04 ENCOUNTER — Other Ambulatory Visit: Payer: Self-pay
